# Patient Record
Sex: Female | Born: 1945 | Race: Black or African American | Hispanic: No | Marital: Married | Smoking: Never smoker
Health system: Southern US, Community
[De-identification: ages and names within clinical notes are randomized; demographics above are authoritative.]

## PROBLEM LIST (undated history)

## (undated) DIAGNOSIS — E119 Type 2 diabetes mellitus without complications: Secondary | ICD-10-CM

## (undated) DIAGNOSIS — I10 Essential (primary) hypertension: Secondary | ICD-10-CM

## (undated) HISTORY — PX: TUBAL LIGATION: SHX77

## (undated) HISTORY — PX: OTHER SURGICAL HISTORY: SHX169

---

## 2000-09-09 ENCOUNTER — Ambulatory Visit (HOSPITAL_COMMUNITY): Admission: RE | Admit: 2000-09-09 | Discharge: 2000-09-09 | Payer: Self-pay | Admitting: *Deleted

## 2000-09-09 ENCOUNTER — Encounter: Payer: Self-pay | Admitting: *Deleted

## 2001-10-22 ENCOUNTER — Encounter: Payer: Self-pay | Admitting: *Deleted

## 2001-10-22 ENCOUNTER — Emergency Department (HOSPITAL_COMMUNITY): Admission: EM | Admit: 2001-10-22 | Discharge: 2001-10-22 | Payer: Self-pay

## 2002-04-21 ENCOUNTER — Emergency Department (HOSPITAL_COMMUNITY): Admission: EM | Admit: 2002-04-21 | Discharge: 2002-04-21 | Payer: Self-pay | Admitting: Internal Medicine

## 2002-04-21 ENCOUNTER — Encounter: Payer: Self-pay | Admitting: Internal Medicine

## 2002-06-29 ENCOUNTER — Ambulatory Visit (HOSPITAL_COMMUNITY): Admission: RE | Admit: 2002-06-29 | Discharge: 2002-06-29 | Payer: Self-pay | Admitting: Emergency Medicine

## 2002-06-29 ENCOUNTER — Encounter: Payer: Self-pay | Admitting: Family Medicine

## 2003-06-14 ENCOUNTER — Ambulatory Visit (HOSPITAL_COMMUNITY): Admission: RE | Admit: 2003-06-14 | Discharge: 2003-06-14 | Payer: Self-pay | Admitting: Family Medicine

## 2003-08-06 ENCOUNTER — Other Ambulatory Visit: Admission: RE | Admit: 2003-08-06 | Discharge: 2003-08-06 | Payer: Self-pay | Admitting: Dermatology

## 2004-06-16 ENCOUNTER — Ambulatory Visit (HOSPITAL_COMMUNITY): Admission: RE | Admit: 2004-06-16 | Discharge: 2004-06-16 | Payer: Self-pay | Admitting: Family Medicine

## 2006-06-03 ENCOUNTER — Ambulatory Visit (HOSPITAL_COMMUNITY): Admission: RE | Admit: 2006-06-03 | Discharge: 2006-06-03 | Payer: Self-pay | Admitting: Family Medicine

## 2007-04-29 ENCOUNTER — Ambulatory Visit: Payer: Self-pay | Admitting: Gastroenterology

## 2007-04-29 ENCOUNTER — Ambulatory Visit (HOSPITAL_COMMUNITY): Admission: RE | Admit: 2007-04-29 | Discharge: 2007-04-29 | Payer: Self-pay | Admitting: Gastroenterology

## 2007-06-07 ENCOUNTER — Ambulatory Visit (HOSPITAL_COMMUNITY): Admission: RE | Admit: 2007-06-07 | Discharge: 2007-06-07 | Payer: Self-pay | Admitting: Nurse Practitioner

## 2008-06-12 ENCOUNTER — Ambulatory Visit (HOSPITAL_COMMUNITY): Admission: RE | Admit: 2008-06-12 | Discharge: 2008-06-12 | Payer: Self-pay | Admitting: Nurse Practitioner

## 2009-06-13 ENCOUNTER — Ambulatory Visit (HOSPITAL_COMMUNITY): Admission: RE | Admit: 2009-06-13 | Discharge: 2009-06-13 | Payer: Self-pay | Admitting: Nurse Practitioner

## 2010-02-16 ENCOUNTER — Encounter: Payer: Self-pay | Admitting: Nurse Practitioner

## 2010-06-10 ENCOUNTER — Other Ambulatory Visit (HOSPITAL_COMMUNITY): Payer: Self-pay | Admitting: Nurse Practitioner

## 2010-06-10 DIAGNOSIS — Z1239 Encounter for other screening for malignant neoplasm of breast: Secondary | ICD-10-CM

## 2010-06-10 NOTE — Op Note (Signed)
[name], [name]              ACCOUNT NO.:  [id number]   MEDICAL RECORD NO.:  [id number]          PATIENT TYPE:  AMB   LOCATION:  DAY                           FACILITY:  [facility]   PHYSICIAN:  Kassie Mends, M.D.      DATE OF BIRTH:  01-Jul-1945   DATE OF PROCEDURE:  04/29/2007  DATE OF DISCHARGE:                               OPERATIVE REPORT   PRIMARY CARE Treshon Stannard:  Concha Pyo, NP   PROCEDURE:  Colonoscopy.   INDICATIONS FOR PROCEDURE:  Martha Ray is a 65 year old female who  presents for average risk colon cancer screening.   FINDINGS:  1. Frequent sigmoid colon diverticula with stools impacted.  2. Small internal hemorrhoids. Otherwise no polyps, masses,      inflammatory changes, or AVMs seen.   RECOMMENDATIONS:  1. She should follow a high fiber diet.  She is given a hand out on      high fiber diet, diverticulosis, and hemorrhoids.  2. Colonoscopy in 10 years.   MEDICATIONS:  1. Demerol 50 mg IV.  2. Versed 5 mg IV.   PROCEDURE TECHNIQUE:  Physical exam was performed.  Informed consent was  obtained from the patient that explained the benefits, risks, and  alternatives to the procedure.  The patient was connected to the monitor  and placed in the left lateral position.  Continuous oxygen was provided  by nasal cannula.  IV medicine administered.  After administration of  sedation and rectal exam, the patient's rectum was intubated and scope  was advanced under  direct visualization to the cecum.  The scope was removed slowly by  carefully examining the color, texture, anatomy, and integrity of the  mucosa on the way out.  The patient was recovered at endoscopy and  discharged home in satisfactory condition.      Kassie Mends, M.D.  Electronically Signed     SM/MEDQ  D:  04/29/2007  T:  04/29/2007  Job:  [id number]   cc:   Concha Pyo, NP  [address] [place], [place] [address]

## 2010-06-17 ENCOUNTER — Ambulatory Visit (HOSPITAL_COMMUNITY)
Admission: RE | Admit: 2010-06-17 | Discharge: 2010-06-17 | Disposition: A | Discharge status: Home / Self Care | Payer: 59 | Source: Ambulatory Visit | Attending: Nurse Practitioner | Admitting: Nurse Practitioner

## 2010-06-17 DIAGNOSIS — Z1231 Encounter for screening mammogram for malignant neoplasm of breast: Secondary | ICD-10-CM | POA: Insufficient documentation

## 2010-06-17 DIAGNOSIS — Z1239 Encounter for other screening for malignant neoplasm of breast: Secondary | ICD-10-CM

## 2010-12-05 ENCOUNTER — Telehealth (HOSPITAL_COMMUNITY): Payer: Self-pay | Admitting: *Deleted

## 2011-06-23 ENCOUNTER — Other Ambulatory Visit (HOSPITAL_COMMUNITY): Payer: Self-pay | Admitting: Nurse Practitioner

## 2011-06-23 DIAGNOSIS — Z139 Encounter for screening, unspecified: Secondary | ICD-10-CM

## 2011-06-24 ENCOUNTER — Other Ambulatory Visit (HOSPITAL_COMMUNITY): Payer: Self-pay | Admitting: Nurse Practitioner

## 2011-06-24 DIAGNOSIS — Z139 Encounter for screening, unspecified: Secondary | ICD-10-CM

## 2011-06-25 ENCOUNTER — Ambulatory Visit (HOSPITAL_COMMUNITY)
Admission: RE | Admit: 2011-06-25 | Discharge: 2011-06-25 | Disposition: A | Discharge status: Home / Self Care | Payer: 59 | Source: Ambulatory Visit | Attending: Nurse Practitioner | Admitting: Nurse Practitioner

## 2011-06-25 DIAGNOSIS — Z1231 Encounter for screening mammogram for malignant neoplasm of breast: Secondary | ICD-10-CM | POA: Insufficient documentation

## 2011-06-25 DIAGNOSIS — Z139 Encounter for screening, unspecified: Secondary | ICD-10-CM

## 2011-06-29 ENCOUNTER — Other Ambulatory Visit (HOSPITAL_COMMUNITY): Payer: 59

## 2012-06-21 ENCOUNTER — Other Ambulatory Visit (HOSPITAL_COMMUNITY): Payer: Self-pay | Admitting: Nurse Practitioner

## 2012-06-21 DIAGNOSIS — Z0000 Encounter for general adult medical examination without abnormal findings: Secondary | ICD-10-CM

## 2012-06-27 ENCOUNTER — Ambulatory Visit (HOSPITAL_COMMUNITY)
Admission: RE | Admit: 2012-06-27 | Discharge: 2012-06-27 | Disposition: A | Discharge status: Home / Self Care | Payer: 59 | Source: Ambulatory Visit | Attending: Nurse Practitioner | Admitting: Nurse Practitioner

## 2012-06-27 DIAGNOSIS — Z1231 Encounter for screening mammogram for malignant neoplasm of breast: Secondary | ICD-10-CM | POA: Insufficient documentation

## 2012-06-27 DIAGNOSIS — Z0000 Encounter for general adult medical examination without abnormal findings: Secondary | ICD-10-CM

## 2013-06-13 ENCOUNTER — Other Ambulatory Visit (HOSPITAL_COMMUNITY): Payer: Self-pay | Admitting: Internal Medicine

## 2013-06-13 DIAGNOSIS — Z1239 Encounter for other screening for malignant neoplasm of breast: Secondary | ICD-10-CM

## 2013-06-13 DIAGNOSIS — Z1231 Encounter for screening mammogram for malignant neoplasm of breast: Secondary | ICD-10-CM

## 2013-06-23 ENCOUNTER — Ambulatory Visit (HOSPITAL_COMMUNITY): Payer: 59

## 2013-07-03 ENCOUNTER — Ambulatory Visit (HOSPITAL_COMMUNITY)
Admission: RE | Admit: 2013-07-03 | Discharge: 2013-07-03 | Disposition: A | Discharge status: Home / Self Care | Payer: 59 | Source: Ambulatory Visit | Attending: Internal Medicine | Admitting: Internal Medicine

## 2013-07-03 DIAGNOSIS — Z1231 Encounter for screening mammogram for malignant neoplasm of breast: Secondary | ICD-10-CM | POA: Insufficient documentation

## 2013-07-21 ENCOUNTER — Encounter (HOSPITAL_COMMUNITY): Payer: Self-pay | Admitting: Emergency Medicine

## 2013-07-21 ENCOUNTER — Emergency Department (HOSPITAL_COMMUNITY)
Admission: EM | Admit: 2013-07-21 | Discharge: 2013-07-21 | Disposition: A | Discharge status: Home / Self Care | Payer: 59 | Attending: Emergency Medicine | Admitting: Emergency Medicine

## 2013-07-21 ENCOUNTER — Emergency Department (HOSPITAL_COMMUNITY): Payer: 59

## 2013-07-21 DIAGNOSIS — S82832A Other fracture of upper and lower end of left fibula, initial encounter for closed fracture: Secondary | ICD-10-CM

## 2013-07-21 DIAGNOSIS — Y9389 Activity, other specified: Secondary | ICD-10-CM | POA: Insufficient documentation

## 2013-07-21 DIAGNOSIS — S82899A Other fracture of unspecified lower leg, initial encounter for closed fracture: Secondary | ICD-10-CM | POA: Insufficient documentation

## 2013-07-21 DIAGNOSIS — R296 Repeated falls: Secondary | ICD-10-CM | POA: Insufficient documentation

## 2013-07-21 DIAGNOSIS — X500XXA Overexertion from strenuous movement or load, initial encounter: Secondary | ICD-10-CM | POA: Insufficient documentation

## 2013-07-21 DIAGNOSIS — Y929 Unspecified place or not applicable: Secondary | ICD-10-CM | POA: Insufficient documentation

## 2013-07-21 DIAGNOSIS — I10 Essential (primary) hypertension: Secondary | ICD-10-CM | POA: Insufficient documentation

## 2013-07-21 HISTORY — DX: Essential (primary) hypertension: I10

## 2013-07-21 MED ORDER — OXYCODONE-ACETAMINOPHEN 5-325 MG PO TABS: 1.0000 | ORAL_TABLET | ORAL | Status: DC | PRN

## 2013-07-21 NOTE — ED Notes (Signed)
Patient states she fell about a foot off her deck; and landed on left ankle and twisted.

## 2013-07-21 NOTE — Discharge Instructions (Signed)
Wear the cam walker until cleared by orthopedic doctor to remove it.  Ankle Fracture A fracture is a break in a bone. The ankle joint is made up of three bones. These include the lower (distal)sections of your lower leg bones, called the tibia and fibula, along with a bone in your foot, called the talus. Depending on how bad the break is and if more than one ankle joint bone is broken, a cast or splint is used to protect and keep your injured bone from moving while it heals. Sometimes, surgery is required to help the fracture heal properly.  There are two general types of fractures:  Stable fracture. This includes a single fracture line through one bone, with no injury to ankle ligaments. A fracture of the talus that does not have any displacement (movement of the bone on either side of the fracture line) is also stable.  Unstable fracture. This includes more than one fracture line through one or more bones in the ankle joint. It also includes fractures that have displacement of the bone on either side of the fracture line. CAUSES  A direct blow to the ankle.   Quickly and severely twisting your ankle.  Trauma, such as a car accident or falling from a significant height. RISK FACTORS You may be at a higher risk of ankle fracture if:  You have certain medical conditions.  You are involved in high-impact sports.  You are involved in a high-impact car accident. SIGNS AND SYMPTOMS   Tender and swollen ankle.  Bruising around the injured ankle.  Pain on movement of the ankle.  Difficulty walking or putting weight on the ankle.  A cold foot below the site of the ankle injury. This can occur if the blood vessels passing through your injured ankle were also damaged.  Numbness in the foot below the site of the ankle injury. DIAGNOSIS  An ankle fracture is usually diagnosed with a physical exam and X-rays. A CT scan may also be required for complex fractures. TREATMENT  Stable  fractures are treated with a cast or splint and using crutches to avoid putting weight on your injured ankle. This is followed by an ankle strengthening program. Some patients require a special type of cast, depending on other medical problems they may have. Unstable fractures require surgery to ensure the bones heal properly. Your health care provider will tell you what type of fracture you have and the best treatment for your condition. HOME CARE INSTRUCTIONS   Review correct crutch use with your health care provider and use your crutches as directed. Safe use of crutches is extremely important. Misuse of crutches can cause you to fall or cause injury to nerves in your hands or armpits.  Do not put weight or pressure on the injured ankle until directed by your health care provider.  To lessen the swelling, keep the injured leg elevated while sitting or lying down.  Apply ice to the injured area:  Put ice in a plastic bag.  Place a towel between your cast and the bag.  Leave the ice on for 20 minutes, 2-3 times a day.  If you have a plaster or fiberglass cast:  Do not try to scratch the skin under the cast with any objects. This can increase your risk of skin infection.  Check the skin around the cast every day. You may put lotion on any red or sore areas.  Keep your cast dry and clean.  If you have a plaster  splint:  Wear the splint as directed.  You may loosen the elastic around the splint if your toes become numb, tingle, or turn cold or blue.  Do not put pressure on any part of your cast or splint; it may break. Rest your cast only on a pillow the first 24 hours until it is fully hardened.  Your cast or splint can be protected during bathing with a plastic bag sealed to your skin with medical tape. Do not lower the cast or splint into water.  Take medicines as directed by your health care provider. Only take over-the-counter or prescription medicines for pain, discomfort, or  fever as directed by your health care provider.  Do not drive a vehicle until your health care provider specifically tells you it is safe to do so.  If your health care provider has given you a follow-up appointment, it is very important to keep that appointment. Not keeping the appointment could result in a chronic or permanent injury, pain, and disability. If you have any problem keeping the appointment, call the facility for assistance. SEEK MEDICAL CARE IF: You develop increased swelling or discomfort. SEEK IMMEDIATE MEDICAL CARE IF:   Your cast gets damaged or breaks.  You have continued severe pain.  You develop new pain or swelling after the cast was put on.  Your skin or toenails below the injury turn blue or gray.  Your skin or toenails below the injury feel cold, numb, or have loss of sensitivity to touch.  There is a bad smell or pus draining from under the cast. MAKE SURE YOU:   Understand these instructions.  Will watch your condition.  Will get help right away if you are not doing well or get worse. Document Released: 01/10/2000 Document Revised: 01/17/2013 Document Reviewed: 08/11/2012 [facility] Patient Information 2015 [place], [place]. This information is not intended to replace advice given to you by your health care provider. Make sure you discuss any questions you have with your health care provider.  Acetaminophen; Oxycodone tablets What is this medicine? ACETAMINOPHEN; OXYCODONE (a set a MEE noe fen; ox i KOE done) is a pain reliever. It is used to treat mild to moderate pain. This medicine may be used for other purposes; ask your health care provider or pharmacist if you have questions. COMMON BRAND NAME(S): Endocet, Magnacet, Narvox, Percocet, Perloxx, Primalev, Primlev, Roxicet, Xolox What should I tell my health care provider before I take this medicine? They need to know if you have any of these conditions: -brain tumor -Crohn's disease, inflammatory  bowel disease, or ulcerative colitis -drug abuse or addiction -head injury -heart or circulation problems -if you often drink alcohol -kidney disease or problems going to the bathroom -liver disease -lung disease, asthma, or breathing problems -an unusual or allergic reaction to acetaminophen, oxycodone, other opioid analgesics, other medicines, foods, dyes, or preservatives -pregnant or trying to get pregnant -breast-feeding How should I use this medicine? Take this medicine by mouth with a full glass of water. Follow the directions on the prescription label. Take your medicine at regular intervals. Do not take your medicine more often than directed. Talk to your pediatrician regarding the use of this medicine in children. Special care may be needed. Patients over 30 years old may have a stronger reaction and need a smaller dose. Overdosage: If you think you have taken too much of this medicine contact a poison control center or emergency room at once. NOTE: This medicine is only for you. Do not share  this medicine with others. What if I miss a dose? If you miss a dose, take it as soon as you can. If it is almost time for your next dose, take only that dose. Do not take double or extra doses. What may interact with this medicine? -alcohol -antihistamines -barbiturates like amobarbital, butalbital, butabarbital, methohexital, pentobarbital, phenobarbital, thiopental, and secobarbital -benztropine -drugs for bladder problems like solifenacin, trospium, oxybutynin, tolterodine, hyoscyamine, and methscopolamine -drugs for breathing problems like ipratropium and tiotropium -drugs for certain stomach or intestine problems like propantheline, homatropine methylbromide, glycopyrrolate, atropine, belladonna, and dicyclomine -general anesthetics like etomidate, ketamine, nitrous oxide, propofol, desflurane, enflurane, halothane, isoflurane, and sevoflurane -medicines for depression, anxiety, or  psychotic disturbances -medicines for sleep -muscle relaxants -naltrexone -narcotic medicines (opiates) for pain -phenothiazines like perphenazine, thioridazine, chlorpromazine, mesoridazine, fluphenazine, prochlorperazine, promazine, and trifluoperazine -scopolamine -tramadol -trihexyphenidyl This list may not describe all possible interactions. Give your health care provider a list of all the medicines, herbs, non-prescription drugs, or dietary supplements you use. Also tell them if you smoke, drink alcohol, or use illegal drugs. Some items may interact with your medicine. What should I watch for while using this medicine? Tell your doctor or health care professional if your pain does not go away, if it gets worse, or if you have new or a different type of pain. You may develop tolerance to the medicine. Tolerance means that you will need a higher dose of the medication for pain relief. Tolerance is normal and is expected if you take this medicine for a long time. Do not suddenly stop taking your medicine because you may develop a severe reaction. Your body becomes used to the medicine. This does NOT mean you are addicted. Addiction is a behavior related to getting and using a drug for a non-medical reason. If you have pain, you have a medical reason to take pain medicine. Your doctor will tell you how much medicine to take. If your doctor wants you to stop the medicine, the dose will be slowly lowered over time to avoid any side effects. You may get drowsy or dizzy. Do not drive, use machinery, or do anything that needs mental alertness until you know how this medicine affects you. Do not stand or sit up quickly, especially if you are an older patient. This reduces the risk of dizzy or fainting spells. Alcohol may interfere with the effect of this medicine. Avoid alcoholic drinks. There are different types of narcotic medicines (opiates) for pain. If you take more than one type at the same time, you  may have more side effects. Give your health care provider a list of all medicines you use. Your doctor will tell you how much medicine to take. Do not take more medicine than directed. Call emergency for help if you have problems breathing. The medicine will cause constipation. Try to have a bowel movement at least every 2 to 3 days. If you do not have a bowel movement for 3 days, call your doctor or health care professional. Do not take Tylenol (acetaminophen) or medicines that have acetaminophen with this medicine. Too much acetaminophen can be very dangerous. Many nonprescription medicines contain acetaminophen. Always read the labels carefully to avoid taking more acetaminophen. What side effects may I notice from receiving this medicine? Side effects that you should report to your doctor or health care professional as soon as possible: -allergic reactions like skin rash, itching or hives, swelling of the face, lips, or tongue -breathing difficulties, wheezing -confusion -light headedness  or fainting spells -severe stomach pain -unusually weak or tired -yellowing of the skin or the whites of the eyes Side effects that usually do not require medical attention (report to your doctor or health care professional if they continue or are bothersome): -dizziness -drowsiness -nausea -vomiting This list may not describe all possible side effects. Call your doctor for medical advice about side effects. You may report side effects to FDA at 1-800-FDA-1088. Where should I keep my medicine? Keep out of the reach of children. This medicine can be abused. Keep your medicine in a safe place to protect it from theft. Do not share this medicine with anyone. Selling or giving away this medicine is dangerous and against the law. Store at room temperature between 20 and 25 degrees C (68 and 77 degrees F). Keep container tightly closed. Protect from light. This medicine may cause accidental overdose and death if  it is taken by other adults, children, or pets. Flush any unused medicine down the toilet to reduce the chance of harm. Do not use the medicine after the expiration date. NOTE: This sheet is a summary. It may not cover all possible information. If you have questions about this medicine, talk to your doctor, pharmacist, or health care provider.  2015, [employer]vier/[employer] Standard. (2012-09-05 13:17:35)

## 2013-07-21 NOTE — ED Provider Notes (Signed)
CSN: [id number]     Arrival date & time 07/21/13  0156 History   First MD Initiated Contact with Patient 07/21/13 0445     Chief Complaint  Patient presents with  . Ankle Pain     (Consider location/radiation/quality/duration/timing/severity/associated sxs/prior Treatment) Patient is a 68 y.o. female presenting with ankle pain. The history is provided by the patient.  Ankle Pain She fell off of her deck and injured her left ankle. She denies other injury. Fall was a distance of about 1 foot. She is complaining of pain in the lateral aspect of her left ankle. This is worse when she ambulates. Pain is moderate and she rates it at 6/10. She has taken naproxen for pain with moderate relief.  Past Medical History  Diagnosis Date  . Hypertension    History reviewed. No pertinent past surgical history. No family history on file. History  Substance Use Topics  . Smoking status: Never Smoker   . Smokeless tobacco: Not on file  . Alcohol Use: No   OB History   Grav Para Term Preterm Abortions TAB SAB Ect Mult Living                 Review of Systems  All other systems reviewed and are negative.     Allergies  Review of patient's allergies indicates no known allergies.  Home Medications   Prior to Admission medications   Medication Sig Start Date End Date Taking? Authorizing Provider  oxyCODONE-acetaminophen (PERCOCET/ROXICET) 5-325 MG per tablet Take 1 tablet by mouth every 4 (four) hours as needed. 07/21/13   Dione Boozeavid Glick, MD   BP 150/81  Pulse 89  Temp(Src) 98 F (36.7 C) (Oral)  Resp 18  Ht 5\' 5"  (1.651 m)  Wt 205 lb (92.987 kg)  BMI 34.11 kg/m2  SpO2 100% Physical Exam  Nursing note and vitals reviewed.  68 year old female, resting comfortably and in no acute distress. Vital signs are significant for hypertension with blood pressure 150/81. Oxygen saturation is 100%, which is normal. Head is normocephalic and atraumatic. PERRLA, EOMI. Oropharynx is clear. Neck is  nontender and supple without adenopathy or JVD. Back is nontender and there is no CVA tenderness. Lungs are clear without rales, wheezes, or rhonchi. Chest is nontender. Heart has regular rate and rhythm without murmur. Abdomen is soft, flat, nontender without masses or hepatosplenomegaly and peristalsis is normoactive. Extremities: There is moderate swelling over the lateral aspect of the left ankle with point tenderness over the lateral malleolus. There is no instability of the ankle mortise and anterior drawer sign is negative. There is a strong dorsalis pedis pulse and capillary refill is prompt. No other extremity injuries seen. Skin is warm and dry without rash. Neurologic: Mental status is normal, cranial nerves are intact, there are no motor or sensory deficits.  ED Course  Procedures (including critical care time)  Imaging Review Dg Ankle Complete Left  07/21/2013   CLINICAL DATA:  Patient fell 1 day ago. Lateral ankle pain with swelling.  EXAM: LEFT ANKLE COMPLETE - 3+ VIEW  COMPARISON:  None.  FINDINGS: Transverse nondisplaced fracture of the distal left fibula with overlying soft tissue swelling.  IMPRESSION: Transverse nondisplaced fracture of the distal left fibula with overlying soft tissue swelling.   Electronically Signed   By: Burman NievesWilliam  Stevens M.D.   On: 07/21/2013 02:31   Images viewed by me.  MDM   Final diagnoses:  Traumatic closed nondisplaced fracture of distal end of left fibula, initial encounter  Fall with a nondisplaced fracture of the lateral malleolus of the left ankle. She is pacemaker M. Zollie BeckersWalter and given crutches and is referred to orthopedics for followup. Prescription is given for oxycodone acetaminophen.    Dione Boozeavid Glick, MD 07/21/13 [phone]

## 2014-08-15 ENCOUNTER — Other Ambulatory Visit (HOSPITAL_COMMUNITY): Payer: Self-pay | Admitting: Sports Medicine

## 2014-08-15 DIAGNOSIS — Z1231 Encounter for screening mammogram for malignant neoplasm of breast: Secondary | ICD-10-CM

## 2014-08-20 ENCOUNTER — Emergency Department (HOSPITAL_COMMUNITY): Payer: Medicare Other

## 2014-08-20 ENCOUNTER — Observation Stay (HOSPITAL_COMMUNITY): Payer: Medicare Other

## 2014-08-20 ENCOUNTER — Inpatient Hospital Stay (HOSPITAL_COMMUNITY)
Admission: EM | Admit: 2014-08-20 | Discharge: 2014-08-22 | DRG: 066 | Disposition: A | Discharge status: Home / Self Care | Payer: Medicare Other | Attending: Internal Medicine | Admitting: Internal Medicine

## 2014-08-20 ENCOUNTER — Encounter (HOSPITAL_COMMUNITY): Payer: Self-pay | Admitting: Emergency Medicine

## 2014-08-20 DIAGNOSIS — E1165 Type 2 diabetes mellitus with hyperglycemia: Secondary | ICD-10-CM | POA: Diagnosis present

## 2014-08-20 DIAGNOSIS — H532 Diplopia: Secondary | ICD-10-CM | POA: Diagnosis not present

## 2014-08-20 DIAGNOSIS — I10 Essential (primary) hypertension: Secondary | ICD-10-CM | POA: Diagnosis present

## 2014-08-20 DIAGNOSIS — Z8673 Personal history of transient ischemic attack (TIA), and cerebral infarction without residual deficits: Secondary | ICD-10-CM

## 2014-08-20 DIAGNOSIS — R739 Hyperglycemia, unspecified: Secondary | ICD-10-CM

## 2014-08-20 DIAGNOSIS — E785 Hyperlipidemia, unspecified: Secondary | ICD-10-CM | POA: Diagnosis present

## 2014-08-20 DIAGNOSIS — E876 Hypokalemia: Secondary | ICD-10-CM | POA: Diagnosis present

## 2014-08-20 DIAGNOSIS — I639 Cerebral infarction, unspecified: Secondary | ICD-10-CM | POA: Diagnosis not present

## 2014-08-20 LAB — COMPREHENSIVE METABOLIC PANEL
ALBUMIN: 3.9 g/dL (ref 3.5–5.0)
ALT: 23 U/L (ref 14–54)
AST: 20 U/L (ref 15–41)
Alkaline Phosphatase: 42 U/L (ref 38–126)
Anion gap: 8 (ref 5–15)
BUN: 14 mg/dL (ref 6–20)
CHLORIDE: 105 mmol/L (ref 101–111)
CO2: 26 mmol/L (ref 22–32)
CREATININE: 1.09 mg/dL — AB (ref 0.44–1.00)
Calcium: 9.3 mg/dL (ref 8.9–10.3)
GFR calc Af Amer: 59 mL/min — ABNORMAL LOW (ref >60)
GFR, EST NON AFRICAN AMERICAN: 51 mL/min — AB (ref >60)
GLUCOSE: 196 mg/dL — AB (ref 65–99)
Potassium: 3.3 mmol/L — ABNORMAL LOW (ref 3.5–5.1)
SODIUM: 139 mmol/L (ref 135–145)
Total Bilirubin: 0.6 mg/dL (ref 0.3–1.2)
Total Protein: 6.6 g/dL (ref 6.5–8.1)

## 2014-08-20 LAB — CBC WITH DIFFERENTIAL/PLATELET
Basophils Absolute: 0.1 10*3/uL (ref 0.0–0.1)
Basophils Relative: 1 % (ref 0–1)
Eosinophils Absolute: 0.1 10*3/uL (ref 0.0–0.7)
Eosinophils Relative: 2 % (ref 0–5)
HEMATOCRIT: 40.5 % (ref 36.0–46.0)
Hemoglobin: 13.7 g/dL (ref 12.0–15.0)
LYMPHS ABS: 1.6 10*3/uL (ref 0.7–4.0)
Lymphocytes Relative: 38 % (ref 12–46)
MCH: 28.3 pg (ref 26.0–34.0)
MCHC: 33.8 g/dL (ref 30.0–36.0)
MCV: 83.7 fL (ref 78.0–100.0)
Monocytes Absolute: 0.4 10*3/uL (ref 0.1–1.0)
Monocytes Relative: 9 % (ref 3–12)
Neutro Abs: 2.1 10*3/uL (ref 1.7–7.7)
Neutrophils Relative %: 50 % (ref 43–77)
Platelets: 366 10*3/uL (ref 150–400)
RBC: 4.84 MIL/uL (ref 3.87–5.11)
RDW: 13 % (ref 11.5–15.5)
WBC: 4.1 10*3/uL (ref 4.0–10.5)

## 2014-08-20 LAB — GLUCOSE, CAPILLARY: Glucose-Capillary: 149 mg/dL — ABNORMAL HIGH (ref 65–99)

## 2014-08-20 MED ORDER — ASPIRIN 325 MG PO TABS
325.0000 mg | ORAL_TABLET | Freq: Every day | ORAL | Status: DC
  Administered 2014-08-20 – 2014-08-22 (×3): 325 mg via ORAL
  Filled 2014-08-20 (×3): qty 1

## 2014-08-20 MED ORDER — ACETAMINOPHEN 650 MG RE SUPP: 650.0000 mg | RECTAL | Status: DC | PRN

## 2014-08-20 MED ORDER — INSULIN ASPART 100 UNIT/ML ~~LOC~~ SOLN
0.0000 [IU] | Freq: Three times a day (TID) | SUBCUTANEOUS | Status: DC
  Administered 2014-08-21: 1 [IU] via SUBCUTANEOUS
  Administered 2014-08-21: 2 [IU] via SUBCUTANEOUS
  Administered 2014-08-22: 1 [IU] via SUBCUTANEOUS
  Administered 2014-08-22: 2 [IU] via SUBCUTANEOUS

## 2014-08-20 MED ORDER — ASPIRIN 300 MG RE SUPP
300.0000 mg | Freq: Every day | RECTAL | Status: DC
  Filled 2014-08-20 (×5): qty 1

## 2014-08-20 MED ORDER — ACETAMINOPHEN 325 MG PO TABS: 650.0000 mg | ORAL_TABLET | ORAL | Status: DC | PRN

## 2014-08-20 MED ORDER — ENOXAPARIN SODIUM 40 MG/0.4ML ~~LOC~~ SOLN
40.0000 mg | SUBCUTANEOUS | Status: DC
  Administered 2014-08-20 – 2014-08-21 (×2): 40 mg via SUBCUTANEOUS
  Filled 2014-08-20 (×2): qty 0.4

## 2014-08-20 MED ORDER — STROKE: EARLY STAGES OF RECOVERY BOOK
Freq: Once | Status: AC
  Administered 2014-08-22: 10:00:00
  Filled 2014-08-20: qty 1
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
## 2014-08-20 NOTE — ED Notes (Signed)
Pt c/o blurred vision and some double vision in left eye since 1430 yesterday. Denies trouble walking/talking. Denies numbness/tingling.

## 2014-08-20 NOTE — ED Notes (Signed)
MD Zammit at bedside. 

## 2014-08-20 NOTE — ED Notes (Signed)
Phlebotomy at bedside.

## 2014-08-20 NOTE — ED Provider Notes (Signed)
CSN: [id number]     Arrival date & time 08/20/14  1035 History   First MD Initiated Contact with Patient 08/20/14 1051     Chief Complaint  Patient presents with  . Blurred Vision     (Consider location/radiation/quality/duration/timing/severity/associated sxs/prior Treatment) Patient is a 69 y.o. female presenting with eye problem. The history is provided by the patient (pt had double vision yesterday and some minor double vision today).  Eye Problem Location:  L eye Quality: no pain. Severity:  Mild Onset quality:  Sudden Timing:  Constant Progression:  Improving Chronicity:  New Associated symptoms: no discharge and no headaches     Past Medical History  Diagnosis Date  . Hypertension    History reviewed. No pertinent past surgical history. No family history on file. History  Substance Use Topics  . Smoking status: Never Smoker   . Smokeless tobacco: Not on file  . Alcohol Use: No   OB History    No data available     Review of Systems  Constitutional: Negative for appetite change and fatigue.  HENT: Negative for congestion, ear discharge and sinus pressure.        Double vision  Eyes: Negative for discharge.  Respiratory: Negative for cough.   Cardiovascular: Negative for chest pain.  Gastrointestinal: Negative for abdominal pain and diarrhea.  Genitourinary: Negative for frequency and hematuria.  Musculoskeletal: Negative for back pain.  Skin: Negative for rash.  Neurological: Negative for seizures and headaches.  Psychiatric/Behavioral: Negative for hallucinations.      Allergies  Review of patient's allergies indicates no known allergies.  Home Medications   Prior to Admission medications   Medication Sig Start Date End Date Taking? Authorizing Provider  losartan-hydrochlorothiazide (HYZAAR) 50-12.5 MG per tablet Take 1 tablet by mouth daily.   Yes Historical Provider, MD  metFORMIN (GLUCOPHAGE) 500 MG tablet Take 500 mg by mouth daily with  breakfast.   Yes Historical Provider, MD  oxyCODONE-acetaminophen (PERCOCET/ROXICET) 5-325 MG per tablet Take 1 tablet by mouth every 4 (four) hours as needed. Patient not taking: Reported on 08/20/2014 07/21/13   Dione Booze, MD   BP 154/88 mmHg  Pulse 80  Temp(Src) 98.1 F (36.7 C) (Oral)  Resp 10  Ht 5\' 6"  (1.676 m)  Wt 193 lb (87.544 kg)  BMI 31.17 kg/m2  SpO2 100% Physical Exam  Constitutional: She is oriented to person, place, and time. She appears well-developed.  HENT:  Head: Normocephalic.  Eyes: Conjunctivae and EOM are normal. Pupils are equal, round, and reactive to light. Left eye exhibits no discharge. No scleral icterus.  Disc sharp  Neck: Neck supple. No thyromegaly present.  Cardiovascular: Normal rate and regular rhythm.  Exam reveals no gallop and no friction rub.   No murmur heard. Pulmonary/Chest: No stridor. She has no wheezes. She has no rales. She exhibits no tenderness.  Abdominal: She exhibits no distension. There is no tenderness. There is no rebound.  Musculoskeletal: Normal range of motion. She exhibits no edema.  Lymphadenopathy:    She has no cervical adenopathy.  Neurological: She is oriented to person, place, and time. She exhibits normal muscle tone. Coordination normal.  Skin: No rash noted. No erythema.  Psychiatric: She has a normal mood and affect. Her behavior is normal.    ED Course  Procedures (including critical care time) Labs Review Labs Reviewed  COMPREHENSIVE METABOLIC PANEL - Abnormal; Notable for the following:    Potassium 3.3 (*)    Glucose, Bld 196 (*)  Creatinine, Ser 1.09 (*)    GFR calc non Af Amer 51 (*)    GFR calc Af Amer 59 (*)    All other components within normal limits  CBC WITH DIFFERENTIAL/PLATELET    Imaging Review Ct Head Wo Contrast  08/20/2014   CLINICAL DATA:  Blurry vision for 1 day, initial encounter  EXAM: CT HEAD WITHOUT CONTRAST  TECHNIQUE: Contiguous axial images were obtained from the base of  the skull through the vertex without intravenous contrast.  COMPARISON:  None.  FINDINGS: The bony calvarium is intact. The ventricles are of normal size and configuration. No findings to suggest acute hemorrhage, acute infarction or space-occupying mass lesion are noted.  IMPRESSION: No acute abnormality noted.   Electronically Signed   By: Alcide Clever M.D.   On: 08/20/2014 11:26   Mr Brain Wo Contrast  08/20/2014   CLINICAL DATA:  Altered mental status and blurred vision, 1 day duration.  EXAM: MRI HEAD WITHOUT CONTRAST  TECHNIQUE: Multiplanar, multiecho pulse sequences of the brain and surrounding structures were obtained without intravenous contrast.  COMPARISON:  CT 08/20/2014  FINDINGS: Diffusion imaging shows a 4 mm acute infarction in the peripheral midportion of the right cerebellum. No other acute infarction is identified.  The brainstem is normal. No other cerebellar abnormality. Within the cerebral hemispheres, there are a few small foci of T2 and FLAIR signal in the white matter consistent with old small vessel infarctions. No cortical or large vessel territory infarction. No mass lesion, hemorrhage, hydrocephalus or extra-axial collection. No pituitary mass. No inflammatory sinus disease. No skull or skullbase lesion.  IMPRESSION: 4 mm acute infarction at the posterior medial midportion of the right cerebellum. No other acute infarction.  Mild chronic small-vessel ischemic change affecting the cerebral hemispheric white matter.   Electronically Signed   By: Paulina Fusi M.D.   On: 08/20/2014 13:19     EKG Interpretation   Date/Time:  Monday August 20 2014 15:08:21 EDT Ventricular Rate:  75 PR Interval:  176 QRS Duration: 95 QT Interval:  366 QTC Calculation: 409 R Axis:   60 Text Interpretation:  Sinus rhythm Low voltage, precordial leads Confirmed  by Tamula Morrical  MD, Graceanna Theissen ([id number]) on 08/20/2014 3:26:45 PM      MDM   Final diagnoses:  Stroke    Mri shows cva.  Will admit      Bethann Berkshire, MD 08/20/14 1531

## 2014-08-20 NOTE — ED Notes (Signed)
Patient would like something to drink at this time. 

## 2014-08-20 NOTE — Progress Notes (Signed)
Pt removed her earrings for CT and placed them into a ziploc bag, when she left the CT room the ziploc bag was in her hand

## 2014-08-21 ENCOUNTER — Observation Stay (HOSPITAL_COMMUNITY): Payer: Medicare Other

## 2014-08-21 DIAGNOSIS — I10 Essential (primary) hypertension: Secondary | ICD-10-CM

## 2014-08-21 DIAGNOSIS — I639 Cerebral infarction, unspecified: Secondary | ICD-10-CM | POA: Diagnosis present

## 2014-08-21 DIAGNOSIS — R739 Hyperglycemia, unspecified: Secondary | ICD-10-CM | POA: Diagnosis not present

## 2014-08-21 DIAGNOSIS — E1165 Type 2 diabetes mellitus with hyperglycemia: Secondary | ICD-10-CM | POA: Diagnosis present

## 2014-08-21 DIAGNOSIS — E876 Hypokalemia: Secondary | ICD-10-CM | POA: Diagnosis present

## 2014-08-21 DIAGNOSIS — E785 Hyperlipidemia, unspecified: Secondary | ICD-10-CM | POA: Diagnosis present

## 2014-08-21 DIAGNOSIS — H532 Diplopia: Secondary | ICD-10-CM | POA: Diagnosis not present

## 2014-08-21 DIAGNOSIS — I63011 Cerebral infarction due to thrombosis of right vertebral artery: Secondary | ICD-10-CM | POA: Diagnosis not present

## 2014-08-21 LAB — LIPID PANEL
CHOL/HDL RATIO: 6.5 ratio
CHOLESTEROL: 208 mg/dL — AB (ref 0–200)
HDL: 32 mg/dL — AB (ref >40)
LDL CALC: 145 mg/dL — AB (ref 0–99)
Triglycerides: 157 mg/dL — ABNORMAL HIGH (ref <150)
VLDL: 31 mg/dL (ref 0–40)

## 2014-08-21 LAB — GLUCOSE, CAPILLARY
Glucose-Capillary: 140 mg/dL — ABNORMAL HIGH (ref 65–99)
Glucose-Capillary: 163 mg/dL — ABNORMAL HIGH (ref 65–99)
Glucose-Capillary: 105 mg/dL — ABNORMAL HIGH (ref 65–99)
Glucose-Capillary: 133 mg/dL — ABNORMAL HIGH (ref 65–99)

## 2014-08-21 MED ORDER — ATORVASTATIN CALCIUM 20 MG PO TABS
20.0000 mg | ORAL_TABLET | Freq: Every day | ORAL | Status: DC
  Administered 2014-08-21: 20 mg via ORAL
  Filled 2014-08-21: qty 1

## 2014-08-21 NOTE — Care Management Note (Signed)
Case Management Note  Patient Details  Name: Martha Ray MRN: [id number] Date of Birth: 09-Sep-1945  Expected Discharge Date:   08/21/2014               Expected Discharge Plan:  Home/Self Care  In-House Referral:  NA  Discharge planning Services  CM Consult  Post Acute Care Choice:  NA Choice offered to:  NA  DME Arranged:    DME Agency:     HH Arranged:    HH Agency:     Status of Service:  Completed, signed off  Medicare Important Message Given:    Date Medicare IM Given:    Medicare IM give by:    Date Additional Medicare IM Given:    Additional Medicare Important Message give by:     If discussed at Long Length of Stay Meetings, dates discussed:    Additional Comments: Pt is from home, lives with family and independent at baseline. Pt has no DME or HH services at discharge. MD anticipates DC in 24 hours. Pt plans to discharge home with self care. At the time of assessment pt OBS, notification signed by patient, copy given to patient and original placed in pt's shadow chart. No CM needs.  Malcolm Metro, RN 08/21/2014, 1:53 PM

## 2014-08-21 NOTE — Progress Notes (Signed)
OT Cancellation Note  Patient Details Name: Martha Ray MRN: [id number] DOB: [birth year]   Cancelled Treatment:     Reason evaluation not completed: Pt unavailable this am, out of room for testing.   Ezra Sites, OTR/L  [phone] 08/21/2014, 8:36 AM
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
## 2014-08-21 NOTE — Progress Notes (Signed)
[facility]                                                                              Patient Demographics  Martha Ray, is a 69 y.o. female, DOB - [birth year], ZOX:[id number]  Admit date - 08/20/2014   Admitting Physician Standley Brooking, MD  Outpatient Primary MD for the patient is [facility]  LOS - 1   Chief Complaint  Patient presents with  . Blurred Vision      Summary 69 year old female presented with complaints of blurry vision.  MRI of the brain confirmed small stroke. Patient admitted for stroke workup.  Assessment & Plan   Acute CVA -Patient presented with Diplopia (which is improving) -CT head: No acute abnormality noted -MRI brain: 4 mm acute infarct at the posterior medial midportion of the right cerebellum -Echocardiogram: Pending -Carotid doppler: 1-49% stenosis proximal left internal carotid artery secondary to small volume heterogeneous but smooth atherosclerotic plaque, right distal carotid intimal medial thickening, vertebral arteries are patent with normal antegrade flow -LDL: 145 -hemoglobin A1c -PT/OT consulted, patient does not need further therapy -Neurology consulted and appreciated -Continue aspirin -Will start atorvastatin  Hyperlipidemia -Lipid panel: total cholesterol 208, triglycerides 157, HDL 32, LDL 145  -Atorvastatin started   Hypokalemia -Replaced, will repeat a BMP   Diabetes Mellitus, type 2 -Hemoglobin A1c pending  -Patient uses metformin at home   Hypertension -Allowing for permissive hypertension, Hyzaar held   Code Status: Full  Family Communication: Husband at bedside  Disposition Plan: Admitted.  Suspect discharge on 08/22/2014  Time Spent in minutes   30 minutes  Procedures  Carotid doppler  Consults   Neurology  DVT Prophylaxis  Lovenox  Lab Results  Component Value Date   PLT 366 08/20/2014    Medications  Scheduled Meds: .  stroke: mapping our early stages of  recovery book   Does not apply Once  . aspirin  300 mg Rectal Daily   Or  . aspirin  325 mg Oral Daily  . atorvastatin  20 mg Oral q1800  . enoxaparin (LOVENOX) injection  40 mg Subcutaneous Q24H  . insulin aspart  0-9 Units Subcutaneous TID WC   Continuous Infusions:  PRN Meds:.acetaminophen **OR** acetaminophen  Antibiotics    Anti-infectives    None      Subjective:   Javier Glazier seen and examined today.  Patient feels that her vision has improved slightly. Denies any chest pain, shortness breath, abdominal pain, dizziness or headache at this time.   Objective:   Filed Vitals:   08/21/14 0100 08/21/14 0300 08/21/14 0625 08/21/14 0700  BP: 134/75 140/80 134/57 134/57  Pulse: 75 80 79 79  Temp: 98.5 F (36.9 C) 97.8 F (36.6 C) 98 F (36.7 C) 98 F (36.7 C)  TempSrc: Oral Oral Oral Oral  Resp: Height:      Weight:      SpO2: 100% 100% 98% 98%    Wt Readings from Last 3 Encounters:  08/20/14 87.227 kg (192 lb 4.8 oz)  07/21/13 92.987 kg (205 lb)     Intake/Output Summary (Last 24 hours) at 08/21/14 1103 Last data  filed at 08/21/14 0800  Gross per 24 hour  Intake    480 ml  Output    650 ml  Net   -170 ml    Exam  General: Well developed, well nourished, NAD, appears stated age  HEENT: NCAT, mucous membranes moist.   Cardiovascular: S1 S2 auscultated, no rubs, murmurs or gallops. Regular rate and rhythm.  Respiratory: Clear to auscultation bilaterally with equal chest rise  Abdomen: Soft, nontender, nondistended, + bowel sounds  Extremities: warm dry without cyanosis clubbing or edema  Neuro: AAOx3, nonfocal  Psych: Normal affect and demeanor with intact judgement and insight  Data Review   Micro Results No results found for this or any previous visit (from the past 240 hour(s)).  Radiology Reports Dg Chest 2 View  08/20/2014   CLINICAL DATA:  Stroke  EXAM: CHEST  2 VIEW  COMPARISON:  None.  FINDINGS: Lungs are under  aerated and grossly clear. Normal heart size. No pneumothorax. No pleural effusion.  IMPRESSION: No active cardiopulmonary disease.   Electronically Signed   By: Jolaine Click M.D.   On: 08/20/2014 20:47   Ct Head Wo Contrast  08/20/2014   CLINICAL DATA:  Blurry vision for 1 day, initial encounter  EXAM: CT HEAD WITHOUT CONTRAST  TECHNIQUE: Contiguous axial images were obtained from the base of the skull through the vertex without intravenous contrast.  COMPARISON:  None.  FINDINGS: The bony calvarium is intact. The ventricles are of normal size and configuration. No findings to suggest acute hemorrhage, acute infarction or space-occupying mass lesion are noted.  IMPRESSION: No acute abnormality noted.   Electronically Signed   By: Alcide Clever M.D.   On: 08/20/2014 11:26   Mr Maxine Glenn Head Wo Contrast  08/21/2014   CLINICAL DATA:  Right cerebellar infarct.  Weakness.  Diplopia  EXAM: MRA HEAD WITHOUT CONTRAST  TECHNIQUE: Angiographic images of the Circle of Willis were obtained using MRA technique without intravenous contrast.  COMPARISON:  MRI brain 08/20/2014  FINDINGS: The internal carotid arteries are within normal limits from the high cervical segments through the ICA termini bilaterally. The A1 and M1 segments are normal. ACA and MCA branch vessels are intact. Artifactual signal loss is noted across the ACA and MCA branch vessels at the slab overlap.  The left vertebral artery is the dominant vessel. PICA origins are visualized and normal bilaterally. The basilar artery is within normal limits. Both posterior cerebral arteries originate from the basilar tip. The PCA branch vessels are within normal limits.  IMPRESSION: Normal variant circle of Willis without evidence for significant proximal stenosis, aneurysm, or branch vessel occlusion.   Electronically Signed   By: Marin Roberts M.D.   On: 08/21/2014 08:59   Mr Brain Wo Contrast  08/20/2014   CLINICAL DATA:  Altered mental status and blurred  vision, 1 day duration.  EXAM: MRI HEAD WITHOUT CONTRAST  TECHNIQUE: Multiplanar, multiecho pulse sequences of the brain and surrounding structures were obtained without intravenous contrast.  COMPARISON:  CT 08/20/2014  FINDINGS: Diffusion imaging shows a 4 mm acute infarction in the peripheral midportion of the right cerebellum. No other acute infarction is identified.  The brainstem is normal. No other cerebellar abnormality. Within the cerebral hemispheres, there are a few small foci of T2 and FLAIR signal in the white matter consistent with old small vessel infarctions. No cortical or large vessel territory infarction. No mass lesion, hemorrhage, hydrocephalus or extra-axial collection. No pituitary mass. No inflammatory sinus disease. No skull or  skullbase lesion.  IMPRESSION: 4 mm acute infarction at the posterior medial midportion of the right cerebellum. No other acute infarction.  Mild chronic small-vessel ischemic change affecting the cerebral hemispheric white matter.   Electronically Signed   By: Paulina Fusi M.D.   On: 08/20/2014 13:19   US Carotid Bilateral  08/21/2014   CLINICAL DATA:  70 year old female with recent right cerebellar infarct  EXAM: BILATERAL CAROTID DUPLEX ULTRASOUND  TECHNIQUE: Wallace Cullens scale imaging, color Doppler and duplex ultrasound were performed of bilateral carotid and vertebral arteries in the neck.  COMPARISON:  Brain MRI 08/20/2014; brain MRI 08/21/2014  FINDINGS: Criteria: Quantification of carotid stenosis is based on velocity parameters that correlate the residual internal carotid diameter with NASCET-based stenosis levels, using the diameter of the distal internal carotid lumen as the denominator for stenosis measurement.  The following velocity measurements were obtained:  RIGHT  ICA:  115/33 cm/sec  CCA:  100/22 cm/sec  SYSTOLIC ICA/CCA RATIO:  1.2  DIASTOLIC ICA/CCA RATIO:  1.5  ECA:  97 cm/sec  LEFT  ICA:  96/32 cm/sec  CCA:  116/27 cm/sec  SYSTOLIC ICA/CCA RATIO:   0.8  DIASTOLIC ICA/CCA RATIO:  1.2  ECA:  93 cm/sec  RIGHT CAROTID ARTERY: Borderline intimal medial thickening. No significant internal carotid artery plaque or evidence of stenosis.  RIGHT VERTEBRAL ARTERY:  Patent with normal antegrade flow.  LEFT CAROTID ARTERY: Intimal medial thickening. Small heterogeneous but smooth atherosclerotic plaque in the proximal internal carotid artery. By peak systolic velocity criteria the estimated stenosis remains less than 50%.  LEFT VERTEBRAL ARTERY:  Patent with normal antegrade flow.  IMPRESSION: 1. Mild (1-49%) stenosis proximal left internal carotid artery secondary to small volume heterogeneous but smooth atherosclerotic plaque. 2. Right distal carotid intimal medial thickening. No significant plaque or evidence of stenosis in the right internal carotid artery. 3. Vertebral arteries are patent with normal antegrade flow.  Signed,  Sterling Big, MD  [facility]  [facility]   Electronically Signed   By: Malachy Moan M.D.   On: 08/21/2014 09:38    CBC  Recent Labs Lab 08/20/14 1127  WBC 4.1  HGB 13.7  HCT 40.5  PLT 366  MCV 83.7  MCH 28.3  MCHC 33.8  RDW 13.0  LYMPHSABS 1.6  MONOABS 0.4  EOSABS 0.1  BASOSABS 0.1    Chemistries   Recent Labs Lab 08/20/14 1127  NA 139  K 3.3*  CL 105  CO2 26  GLUCOSE 196*  BUN 14  CREATININE 1.09*  CALCIUM 9.3  AST 20  ALT 23  ALKPHOS 42  BILITOT 0.6   ------------------------------------------------------------------------------------------------------------------ estimated creatinine clearance is 55 mL/min (by C-G formula based on Cr of 1.09). ------------------------------------------------------------------------------------------------------------------ No results for input(s): HGBA1C in the last 72 hours. ------------------------------------------------------------------------------------------------------------------  Recent Labs   08/21/14 0615  CHOL 208*  HDL 32*  LDLCALC 145*  TRIG 157*  CHOLHDL 6.5   ------------------------------------------------------------------------------------------------------------------ No results for input(s): TSH, T4TOTAL, T3FREE, THYROIDAB in the last 72 hours.  Invalid input(s): FREET3 ------------------------------------------------------------------------------------------------------------------ No results for input(s): VITAMINB12, FOLATE, FERRITIN, TIBC, IRON, RETICCTPCT in the last 72 hours.  Coagulation profile No results for input(s): INR, PROTIME in the last 168 hours.  No results for input(s): DDIMER in the last 72 hours.  Cardiac Enzymes No results for input(s): CKMB, TROPONINI, MYOGLOBIN in the last 168 hours.  Invalid input(s): CK ------------------------------------------------------------------------------------------------------------------ Invalid input(s): POCBNP    Keltin Baird D.O. on 08/21/2014 at 11:03 AM  Between 7am  to 7pm - Pager - [phone]  After 7pm go to [email or web address] - password [email or web address]  And look for the night coverage person covering for me after hours  [facility] Office  [phone]
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
## 2014-08-21 NOTE — Plan of Care (Signed)
Problem: Consults Goal: Ischemic Stroke Patient Education See Patient Education Module for education specifics.  Outcome: Completed/Met Date Met:  08/21/14 Stroke Mapping booklet explained and given to patient.

## 2014-08-22 DIAGNOSIS — I63011 Cerebral infarction due to thrombosis of right vertebral artery: Secondary | ICD-10-CM

## 2014-08-22 LAB — BASIC METABOLIC PANEL
Anion gap: 9 (ref 5–15)
BUN: 15 mg/dL (ref 6–20)
CALCIUM: 9.1 mg/dL (ref 8.9–10.3)
CO2: 26 mmol/L (ref 22–32)
Chloride: 105 mmol/L (ref 101–111)
Creatinine, Ser: 0.93 mg/dL (ref 0.44–1.00)
GLUCOSE: 147 mg/dL — AB (ref 65–99)
Potassium: 3.6 mmol/L (ref 3.5–5.1)
Sodium: 140 mmol/L (ref 135–145)

## 2014-08-22 LAB — GLUCOSE, CAPILLARY
Glucose-Capillary: 149 mg/dL — ABNORMAL HIGH (ref 65–99)
Glucose-Capillary: 169 mg/dL — ABNORMAL HIGH (ref 65–99)

## 2014-08-22 LAB — HEMOGLOBIN A1C
Hgb A1c MFr Bld: 6.3 % — ABNORMAL HIGH (ref 4.8–5.6)
MEAN PLASMA GLUCOSE: 134 mg/dL

## 2014-08-22 MED ORDER — ATORVASTATIN CALCIUM 20 MG PO TABS: 20.0000 mg | ORAL_TABLET | Freq: Every day | ORAL | Status: AC

## 2014-08-22 MED ORDER — ASPIRIN 325 MG PO TABS: 325.0000 mg | ORAL_TABLET | Freq: Every day | ORAL | Status: AC

## 2014-08-22 NOTE — Care Management Important Message (Signed)
Important Message  Patient Details  Name: Martha Ray MRN: [id number] Date of Birth: 12/09/1945   Medicare Important Message Given:  N/A - LOS <3 / Initial given by admissions    Cheryl Flash, RN 08/22/2014, 12:24 PM
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
## 2014-08-22 NOTE — Progress Notes (Signed)
OT Screen  Patient Details Name: Martha Ray MRN: [id number] DOB: December 19, 1945   OT Screen:      Reason evaluation not completed: Pt screened for OT needs. Pt demonstrates good BUE range of motion and 5/5 strength. Pt reports the diplopia is gradually resolving. Pt is independent in BADL tasks, pt took shower last night and made her bed. Pt is at baseline with BADL tasks, no further OT services required at this time.   Ezra Sites, OTR/L  [phone]  08/22/2014, 8:52 AM

## 2014-08-22 NOTE — Progress Notes (Signed)
Pt discharged home today per Dr. Ardyth Harps.  Pt's VSS.  Pt provided with home medication list, discharge instructions and prescriptions.  Verbalized understanding.  Pt left floor via WC in stable condition accompanied by NT.

## 2014-08-22 NOTE — Care Management Note (Signed)
Case Management Note  Patient Details  Name: ODETTA FORNESS MRN: [id number] Date of Birth: Jun 12, 1945  Subjective/Objective:                    Action/Plan:   Expected Discharge Date:                  Expected Discharge Plan:  Home/Self Care  In-House Referral:  NA  Discharge planning Services  CM Consult  Post Acute Care Choice:  NA Choice offered to:  NA  DME Arranged:    DME Agency:     HH Arranged:    HH Agency:     Status of Service:  Completed, signed off  Medicare Important Message Given:    Date Medicare IM Given:    Medicare IM give by:    Date Additional Medicare IM Given:    Additional Medicare Important Message give by:     If discussed at Long Length of Stay Meetings, dates discussed:    Additional Comments: Pt discharged home today. No CM needs noted. Arlyss Queen [place], RN 08/22/2014, 12:24 PM

## 2014-08-30 ENCOUNTER — Ambulatory Visit (HOSPITAL_COMMUNITY)
Admission: RE | Admit: 2014-08-30 | Discharge: 2014-08-30 | Disposition: A | Discharge status: Home / Self Care | Payer: Medicare Other | Source: Ambulatory Visit | Attending: Sports Medicine | Admitting: Sports Medicine

## 2014-08-30 DIAGNOSIS — Z1231 Encounter for screening mammogram for malignant neoplasm of breast: Secondary | ICD-10-CM | POA: Diagnosis not present

## 2015-08-12 ENCOUNTER — Other Ambulatory Visit (HOSPITAL_COMMUNITY): Payer: Self-pay | Admitting: Emergency Medicine

## 2015-08-12 DIAGNOSIS — Z1231 Encounter for screening mammogram for malignant neoplasm of breast: Secondary | ICD-10-CM

## 2015-09-04 ENCOUNTER — Ambulatory Visit (HOSPITAL_COMMUNITY)
Admission: RE | Admit: 2015-09-04 | Discharge: 2015-09-04 | Disposition: A | Discharge status: Home / Self Care | Payer: Medicare HMO | Source: Ambulatory Visit | Attending: Emergency Medicine | Admitting: Emergency Medicine

## 2015-09-04 DIAGNOSIS — Z1231 Encounter for screening mammogram for malignant neoplasm of breast: Secondary | ICD-10-CM | POA: Diagnosis not present

## 2016-07-28 ENCOUNTER — Other Ambulatory Visit (HOSPITAL_COMMUNITY): Payer: Self-pay | Admitting: Emergency Medicine

## 2016-07-28 DIAGNOSIS — Z1231 Encounter for screening mammogram for malignant neoplasm of breast: Secondary | ICD-10-CM

## 2016-09-07 ENCOUNTER — Ambulatory Visit (HOSPITAL_COMMUNITY): Payer: Medicare HMO

## 2016-10-08 ENCOUNTER — Ambulatory Visit (HOSPITAL_COMMUNITY): Payer: Medicare HMO

## 2017-03-23 ENCOUNTER — Encounter: Payer: Self-pay | Admitting: Gastroenterology

## 2017-04-15 ENCOUNTER — Ambulatory Visit (INDEPENDENT_AMBULATORY_CARE_PROVIDER_SITE_OTHER): Payer: Medicare HMO

## 2017-04-15 DIAGNOSIS — Z1211 Encounter for screening for malignant neoplasm of colon: Secondary | ICD-10-CM

## 2017-04-15 MED ORDER — PEG 3350-KCL-NA BICARB-NACL 420 G PO SOLR: 4000.0000 mL | ORAL | 0 refills | Status: DC

## 2017-04-15 NOTE — Patient Instructions (Signed)
Martha Ray   Mar 30, 1945 MRN: [id number]    Procedure Date: 06/11/17 Time to register: 11:00 Place to register: [facility] Procedure Time: 12:00 Scheduled provider: Barney Drain, MD  PREPARATION FOR COLONOSCOPY WITH TRI-LYTE SPLIT PREP  Please notify us immediately if you are diabetic, take iron supplements, or if you are on Coumadin or any other blood thinners.     You will need to purchase 1 fleet enema and 1 box of Bisacodyl 75m tablets.    1 DAY BEFORE PROCEDURE:  DATE: 06/10/17  DAY: Thursday Continue clear liquids the entire day - NO SOLID FOOD.   Diabetic medications adjustments for today: will mail a letter with this information   At 2:00 pm:  Take 2 Bisacodyl tablets.   At 4:00pm:  Start drinking your solution. Make sure you mix well per instructions on the bottle. Try to drink 1 (one) 8 ounce glass every 10-15 minutes until you have consumed HALF the jug. You should complete by 6:00pm.You must keep the left over solution refrigerated until completed next day.  Continue clear liquids. You must drink plenty of clear liquids to prevent dehyration and kidney failure. Nothing to eat or drink after midnight.  EXCEPTION: If you take medications for your heart, blood pressure or breathing, you may take these medications with a small amount of clear liquid.    DAY OF PROCEDURE:   DATE: 06/11/17  DAY: Friday  Diabetic medications adjustments for today: see letter  Five hours before your procedure time @ 7:00am:  Finish remaining amout of bowel prep, drinking 1 (one) 8 ounce glass every 10-15 minutes until complete. You have two hours to consume remaining prep.   Three hours before your procedure time @9 :00am:  Nothing by mouth.   At least one hour before going to the hospital:  Give yourself one Fleet enema. You may take your morning medications with sip of water unless we have instructed otherwise.      Please see below for Dietary Information.  CLEAR  LIQUIDS INCLUDE:  Water Jello (NOT red in color)   Ice Popsicles (NOT red in color)   Tea (sugar ok, no milk/cream) Powdered fruit flavored drinks  Coffee (sugar ok, no milk/cream) Gatorade/ Lemonade/ Kool-Aid  (NOT red in color)   Juice: apple, white grape, white cranberry Soft drinks  Clear bullion, consomme, broth (fat free beef/chicken/vegetable)  Carbonated beverages (any kind)  Strained chicken noodle soup Hard Candy   Remember: Clear liquids are liquids that will allow you to see your fingers on the other side of a clear glass. Be sure liquids are NOT red in color, and not cloudy, but CLEAR.  DO NOT EAT OR DRINK ANY OF THE FOLLOWING:  Dairy products of any kind   Cranberry juice Tomato juice / V8 juice   Grapefruit juice Orange juice     Red grape juice  Do not eat any solid foods, including such foods as: cereal, oatmeal, yogurt, fruits, vegetables, creamed soups, eggs, bread, crackers, pureed foods in a blender, etc.   HELPFUL HINTS FOR DRINKING PREP SOLUTION:   Make sure prep is extremely cold. Mix and refrigerate the the morning of the prep. You may also put in the freezer.   You may try mixing some Crystal Light or Country Time Lemonade if you prefer. Mix in small amounts; add more if necessary.  Try drinking through a straw  Rinse mouth with water or a mouthwash between glasses, to remove after-taste.  Try sipping on a  cold beverage /ice/ popsicles between glasses of prep.  Place a piece of sugar-free hard candy in mouth between glasses.  If you become nauseated, try consuming smaller amounts, or stretch out the time between glasses. Stop for 30-60 minutes, then slowly start back drinking.        OTHER INSTRUCTIONS  You will need a responsible adult at least 71 years of age to accompany you and drive you home. This person must remain in the waiting room during your procedure. The hospital will cancel your procedure if you do not have a responsible adult with  you.   1. Wear loose fitting clothing that is easily removed. 2. Leave jewelry and other valuables at home.  3. Remove all body piercing jewelry and leave at home. 4. Total time from sign-in until discharge is approximately 2-3 hours. 5. You should go home directly after your procedure and rest. You can resume normal activities the day after your procedure. 6. The day of your procedure you should not:  Drive  Make legal decisions  Operate machinery  Drink alcohol  Return to work   You may call the office (Dept: [phone]) before 5:00pm, or page the doctor on call [phone]) after 5:00pm, for further instructions, if necessary.   Insurance Information YOU WILL NEED TO CHECK WITH YOUR INSURANCE COMPANY FOR THE BENEFITS OF COVERAGE YOU HAVE FOR THIS PROCEDURE.  UNFORTUNATELY, NOT ALL INSURANCE COMPANIES HAVE BENEFITS TO COVER ALL OR PART OF THESE TYPES OF PROCEDURES.  IT IS YOUR RESPONSIBILITY TO CHECK YOUR BENEFITS, HOWEVER, WE WILL BE GLAD TO ASSIST YOU WITH ANY CODES YOUR INSURANCE COMPANY MAY NEED.    PLEASE NOTE THAT MOST INSURANCE COMPANIES WILL NOT COVER A SCREENING COLONOSCOPY FOR PEOPLE UNDER THE AGE OF 50  IF YOU HAVE BCBS INSURANCE, YOU MAY HAVE BENEFITS FOR A SCREENING COLONOSCOPY BUT IF POLYPS ARE FOUND THE DIAGNOSIS WILL CHANGE AND THEN YOU MAY HAVE A DEDUCTIBLE THAT WILL NEED TO BE MET. SO PLEASE MAKE SURE YOU CHECK YOUR BENEFITS FOR A SCREENING COLONOSCOPY AS WELL AS A DIAGNOSTIC COLONOSCOPY.

## 2017-04-15 NOTE — Progress Notes (Signed)
Gastroenterology Pre-Procedure Review  Request Date:04/15/17 Requesting Physician: 10 year recall  Last tcs 04/29/07 SL[facility] diverticula and hemorrhoids, no polyps,   PATIENT REVIEW QUESTIONS: The patient responded to the following health history questions as indicated:    1. Diabetes Melitis: yes metformin 2. Joint replacements in the past 12 months: no 3. Major health problems in the past 3 months: no 4. Has an artificial valve or MVP: no 5. Has a defibrillator: no 6. Has been advised in past to take antibiotics in advance of a procedure like teeth cleaning: no 7. Family history of colon cancer: no  8. Alcohol Use: no 9. History of sleep apnea: no  10. History of coronary artery or other vascular stents placed within the last 12 months: no 11. History of any prior anesthesia complications: no    MEDICATIONS & ALLERGIES:    Patient reports the following regarding taking any blood thinners:   Plavix? no Aspirin? yes (325mg ) Coumadin? no Brilinta? no Xarelto? no Eliquis? no Pradaxa? no Savaysa? no Effient? no  Patient confirms/reports the following medications:  Current Outpatient Medications  Medication Sig Dispense Refill  . aspirin 325 MG tablet Take 1 tablet (325 mg total) by mouth daily.    Marland Kitchen. atorvastatin (LIPITOR) 20 MG tablet Take 1 tablet (20 mg total) by mouth daily at 6 PM. 30 tablet 2  . co-enzyme Q-10 50 MG capsule Take 50 mg by mouth daily.    Marland Kitchen. losartan-hydrochlorothiazide (HYZAAR) 50-12.5 MG per tablet Take 1 tablet by mouth daily.    . metFORMIN (GLUCOPHAGE) 500 MG tablet Take 500 mg by mouth daily with breakfast.     No current facility-administered medications for this visit.     Patient confirms/reports the following allergies:  No Known Allergies  No orders of the defined types were placed in this encounter.   AUTHORIZATION INFORMATION Primary Insurance: [employer] medicare,  ID #: [id number] Pre-Cert / Auth required: no    SCHEDULE INFORMATION: Procedure  has been scheduled as follows:  Date: 06/11/17, Time: 12:00 Location:[facility]  This Gastroenterology Pre-Precedure Review Form is being routed to the following provider(s): Tana CoastLeslie Lewis, PA

## 2017-04-25 NOTE — Progress Notes (Signed)
Ok to schedule.  Day of bowel prep: 1/2 dose metformin Am of tcs: hold metformin

## 2017-04-27 NOTE — Progress Notes (Signed)
Letter mailed to the pt with DM medication instructions.  

## 2017-06-11 ENCOUNTER — Ambulatory Visit (HOSPITAL_COMMUNITY)
Admission: RE | Admit: 2017-06-11 | Discharge: 2017-06-11 | Disposition: A | Discharge status: Home / Self Care | Payer: Medicare HMO | Source: Ambulatory Visit | Attending: Gastroenterology | Admitting: Gastroenterology

## 2017-06-11 ENCOUNTER — Encounter (HOSPITAL_COMMUNITY): Payer: Self-pay | Admitting: *Deleted

## 2017-06-11 ENCOUNTER — Encounter (HOSPITAL_COMMUNITY)
Admission: RE | Disposition: A | Discharge status: Home / Self Care | Payer: Self-pay | Source: Ambulatory Visit | Attending: Gastroenterology

## 2017-06-11 ENCOUNTER — Other Ambulatory Visit: Payer: Self-pay

## 2017-06-11 DIAGNOSIS — E119 Type 2 diabetes mellitus without complications: Secondary | ICD-10-CM | POA: Diagnosis not present

## 2017-06-11 DIAGNOSIS — K648 Other hemorrhoids: Secondary | ICD-10-CM | POA: Diagnosis not present

## 2017-06-11 DIAGNOSIS — Z7984 Long term (current) use of oral hypoglycemic drugs: Secondary | ICD-10-CM | POA: Diagnosis not present

## 2017-06-11 DIAGNOSIS — K644 Residual hemorrhoidal skin tags: Secondary | ICD-10-CM | POA: Insufficient documentation

## 2017-06-11 DIAGNOSIS — Z79899 Other long term (current) drug therapy: Secondary | ICD-10-CM | POA: Diagnosis not present

## 2017-06-11 DIAGNOSIS — K5730 Diverticulosis of large intestine without perforation or abscess without bleeding: Secondary | ICD-10-CM | POA: Diagnosis not present

## 2017-06-11 DIAGNOSIS — Z809 Family history of malignant neoplasm, unspecified: Secondary | ICD-10-CM | POA: Diagnosis not present

## 2017-06-11 DIAGNOSIS — Z7982 Long term (current) use of aspirin: Secondary | ICD-10-CM | POA: Diagnosis not present

## 2017-06-11 DIAGNOSIS — I10 Essential (primary) hypertension: Secondary | ICD-10-CM | POA: Insufficient documentation

## 2017-06-11 DIAGNOSIS — Z1211 Encounter for screening for malignant neoplasm of colon: Secondary | ICD-10-CM | POA: Insufficient documentation

## 2017-06-11 HISTORY — DX: Type 2 diabetes mellitus without complications: E11.9

## 2017-06-11 HISTORY — PX: COLONOSCOPY: SHX5424

## 2017-06-11 SURGERY — COLONOSCOPY
Anesthesia: Moderate Sedation

## 2017-06-11 MED ORDER — MEPERIDINE HCL 100 MG/ML IJ SOLN
INTRAMUSCULAR | Status: AC
  Filled 2017-06-11: qty 2

## 2017-06-11 MED ORDER — MEPERIDINE HCL 100 MG/ML IJ SOLN
INTRAMUSCULAR | Status: DC | PRN
  Administered 2017-06-11: 50 mg via INTRAVENOUS
  Administered 2017-06-11: 25 mg via INTRAVENOUS

## 2017-06-11 MED ORDER — SODIUM CHLORIDE 0.9 % IV SOLN
INTRAVENOUS | Status: DC
  Administered 2017-06-11: 11:00:00 via INTRAVENOUS

## 2017-06-11 MED ORDER — STERILE WATER FOR IRRIGATION IR SOLN
Status: DC | PRN
  Administered 2017-06-11: 2.5 mL

## 2017-06-11 MED ORDER — MIDAZOLAM HCL 5 MG/5ML IJ SOLN
INTRAMUSCULAR | Status: DC | PRN
  Administered 2017-06-11: 1 mg via INTRAVENOUS
  Administered 2017-06-11 (×2): 2 mg via INTRAVENOUS

## 2017-06-11 MED ORDER — MIDAZOLAM HCL 5 MG/5ML IJ SOLN
INTRAMUSCULAR | Status: AC
  Filled 2017-06-11: qty 10

## 2017-06-11 NOTE — H&P (Signed)
Primary Care Physician:  [facility] Primary Gastroenterologist:  Dr. Darrick Penna  Pre-Procedure History & Physical: HPI:  Martha Ray is a 72 y.o. female here for COLON CANCER SCREENING.  Past Medical History:  Diagnosis Date  . Diabetes mellitus without complication (HCC)   . Hypertension     Past Surgical History:  Procedure Laterality Date  . None    . TUBAL LIGATION      Prior to Admission medications   Medication Sig Start Date End Date Taking? Authorizing Provider  aspirin 325 MG tablet Take 1 tablet (325 mg total) by mouth daily. 08/22/14  Yes Philip Aspen, Limmie Patricia, MD  atorvastatin (LIPITOR) 20 MG tablet Take 1 tablet (20 mg total) by mouth daily at 6 PM. 08/22/14  Yes Philip Aspen, Limmie Patricia, MD  co-enzyme Q-10 50 MG capsule Take 50 mg by mouth daily.   Yes [provider]  losartan-hydrochlorothiazide (HYZAAR) 50-12.5 MG per tablet Take 1 tablet by mouth daily.   Yes [provider]  metFORMIN (GLUCOPHAGE) 500 MG tablet Take 500 mg by mouth 2 (two) times daily with a meal.    Yes [provider]  polyethylene glycol-electrolytes (TRILYTE) 420 g solution Take 4,000 mLs by mouth as directed. 04/15/17  Yes Tiffany Kocher, PA-C    Allergies as of 04/15/2017  . (No Known Allergies)    Family History  Problem Relation Age of Onset  . Cancer Mother   . Colon cancer Neg Hx   . Colon polyps Neg Hx     Social History   Socioeconomic History  . Marital status: Married    Spouse name: Not on file  . Number of children: Not on file  . Years of education: Not on file  . Highest education level: Not on file  Occupational History  . Not on file  Social Needs  . Financial resource strain: Not on file  . Food insecurity:    Worry: Not on file    Inability: Not on file  . Transportation needs:    Medical: Not on file    Non-medical: Not on file  Tobacco Use  . Smoking status: Never Smoker  . Smokeless  tobacco: Never Used  Substance and Sexual Activity  . Alcohol use: No  . Drug use: No  . Sexual activity: Not on file  Lifestyle  . Physical activity:    Days per week: Not on file    Minutes per session: Not on file  . Stress: Not on file  Relationships  . Social connections:    Talks on phone: Not on file    Gets together: Not on file    Attends religious service: Not on file    Active member of club or organization: Not on file    Attends meetings of clubs or organizations: Not on file    Relationship status: Not on file  . Intimate partner violence:    Fear of current or ex partner: Not on file    Emotionally abused: Not on file    Physically abused: Not on file    Forced sexual activity: Not on file  Other Topics Concern  . Not on file  Social History Narrative  . Not on file    Review of Systems: See HPI, otherwise negative ROS   Physical Exam: BP (!) 155/82   Temp 98 F (36.7 C) (Oral)   Resp 16   Ht  (1.651 m)   Wt 180  lb (81.6 kg)   SpO2 98%   BMI 29.95 kg/m  General:   Alert,  pleasant and cooperative in NAD Head:  Normocephalic and atraumatic. Neck:  Supple; Lungs:  Clear throughout to auscultation.    Heart:  Regular rate and rhythm. Abdomen:  Soft, nontender and nondistended. Normal bowel sounds, without guarding, and without rebound.   Neurologic:  Alert and  oriented x4;  grossly normal neurologically.  Impression/Plan:     SCREENING  Plan:  1. TCS TODAY. DISCUSSED PROCEDURE, BENEFITS, & RISKS: < 1% chance of medication reaction, bleeding, perforation, or rupture of spleen/liver.

## 2017-06-11 NOTE — Op Note (Signed)
[facility] Patient Name: Martha Ray Procedure Date: 06/11/2017 11:02 AM MRN: [id number] Date of Birth: 1946-01-22 Attending MD: Jonette Eva MD, MD CSN: [id number] Age: 72 Admit Type: Outpatient Procedure:                Colonoscopy, SCREENING Indications:              Screening for colorectal malignant neoplasm Providers:                Jonette Eva MD, MD, Nena Polio, RN, Burke Keels, Technician Referring MD:              Medicines:                Meperidine 75 mg IV, Midazolam 5 mg IV Complications:            No immediate complications. Estimated Blood Loss:     Estimated blood loss: none. Procedure:                Pre-Anesthesia Assessment:                           - Prior to the procedure, a History and Physical                            was performed, and patient medications and                            allergies were reviewed. The patient's tolerance of                            previous anesthesia was also reviewed. The risks                            and benefits of the procedure and the sedation                            options and risks were discussed with the patient.                            All questions were answered, and informed consent                            was obtained. Prior Anticoagulants: The patient has                            taken aspirin, last dose was 7 days prior to                            procedure. ASA Grade Assessment: II - A patient                            with mild systemic disease. After reviewing the  risks and benefits, the patient was deemed in                            satisfactory condition to undergo the procedure.                            After obtaining informed consent, the colonoscope                            was passed under direct vision. Throughout the                            procedure, the patient's blood pressure, pulse, and                             oxygen saturations were monitored continuously. The                            EC38-[id number]) scope was introduced through                            the anus and advanced to the the cecum, identified                            by appendiceal orifice and ileocecal valve. The                            colonoscopy was technically difficult and complex                            due to significant looping. Successful completion                            of the procedure was aided by increasing the dose                            of sedation medication, straightening and                            shortening the scope to obtain bowel loop reduction                            and COLOWRAP. The patient tolerated the procedure                            fairly well. The quality of the bowel preparation                            was good. The ileocecal valve, appendiceal orifice,                            and rectum were photographed. Scope In: 11:28:14 AM Scope Out: 11:44:21 AM Scope Withdrawal Time: 0 hours 9 minutes 20 seconds  Total Procedure Duration: 0 hours 16 minutes 7 seconds  Findings:      Multiple small and large-mouthed diverticula were found in the       recto-sigmoid colon, sigmoid colon, descending colon, splenic flexure       and transverse colon.      The recto-sigmoid colon, sigmoid colon and descending colon revealed       significantly excessive looping.      External and internal hemorrhoids were found during retroflexion. The       hemorrhoids were small. Impression:               - MODERATE Diverticulosis in the recto-sigmoid                            colon, in the sigmoid colon, in the descending                            colon, at the splenic flexure and in the transverse                            colon.                           - There was significant looping of the colon.                           - External and internal  hemorrhoids. Moderate Sedation:      Moderate (conscious) sedation was administered by the endoscopy nurse       and supervised by the endoscopist. The following parameters were       monitored: oxygen saturation, heart rate, blood pressure, and response       to care. Total physician intraservice time was 28 minutes. Recommendation:           - High fiber diet.                           - Continue present medications. LOSE WEIGHT.                           - Repeat colonoscopy is not recommended due to                            current age (53 years or older) for surveillance.                           - Patient has a contact number available for                            emergencies. The signs and symptoms of potential                            delayed complications were discussed with the                            patient. Return to normal activities tomorrow.  Written discharge instructions were provided to the                            patient. Procedure Code(s):        --- Professional ---                           [id number], Colonoscopy, flexible; diagnostic, including                            collection of specimen(s) by brushing or washing,                            when performed (separate procedure)                           G0500, Moderate sedation services provided by the                            same physician or other qualified health care                            professional performing a gastrointestinal                            endoscopic service that sedation supports,                            requiring the presence of an independent trained                            observer to assist in the monitoring of the                            patient's level of consciousness and physiological                            status; initial 15 minutes of intra-service time;                            patient age 63 years or older (additional  time may                            be reported with [id number], as appropriate)                           [phone], Moderate sedation services provided by the                            same physician or other qualified health care                            professional performing the diagnostic or  therapeutic service that the sedation supports,                            requiring the presence of an independent trained                            observer to assist in the monitoring of the                            patient's level of consciousness and physiological                            status; each additional 15 minutes intraservice                            time (List separately in addition to code for                            primary service) Diagnosis Code(s):        --- Professional ---                           Z12.11, Encounter for screening for malignant                            neoplasm of colon                           K64.8, Other hemorrhoids                           K57.30, Diverticulosis of large intestine without                            perforation or abscess without bleeding CPT copyright 2017 [employer] Association. All rights reserved. The codes documented in this report are preliminary and upon coder review may  be revised to meet current compliance requirements. Jonette Eva, MD Jonette Eva MD, MD 06/11/2017 11:58:59 AM This report has been signed electronically. Number of Addenda: 0

## 2017-06-11 NOTE — Discharge Instructions (Signed)
You have small internal  AND EXTERNAL hemorrhoids and diverticulosis IN YOUR LEFT AND RIGHT COLON.    DRINK WATER TO KEEP YOUR URINE LIGHT YELLOW.  KEEP YOUR BODY MASS INDEX UNDER 30 WHICH MEANS YOU ARE OBESE. IT IS CURRENTLY 29.95. OBESITY IS ASSOCIATED WITH AN INCREASED FOR ALL CANCERS, INCLUDING ESOPHAGEAL AND COLON CANCER. A WEIGHT OF 180 LBS OR LESS  WILL KEEP YOUR BODY MASS INDEX(BMI) UNDER 30.  FOLLOW A HIGH FIBER DIET. AVOID ITEMS THAT CAUSE BLOATING. See info below.  USE PREPARATION H FOUR TIMES  A DAY IF NEEDED TO RELIEVE RECTAL PAIN/PRESSURE/BLEEDING.  We do not routinely screen for polyps after the age of 47. HOWEVER I WOULD CONSIDER A colonoscopy in 10-15 years IF THE BENEFITS OUTWEIGH THE RISKS.  Colonoscopy Care After Read the instructions outlined below and refer to this sheet in the next week. These discharge instructions provide you with general information on caring for yourself after you leave the hospital. While your treatment has been planned according to the most current medical practices available, unavoidable complications occasionally occur. If you have any problems or questions after discharge, call DR. Shakeem Stern, [phone].  ACTIVITY You may resume your regular activity, but move at a slower pace for the next 24 hours.  Take frequent rest periods for the next 24 hours.  Walking will help get rid of the air and reduce the bloated feeling in your belly (abdomen).  No driving for 24 hours (because of the medicine (anesthesia) used during the test).  You may shower.  Do not sign any important legal documents or operate any machinery for 24 hours (because of the anesthesia used during the test).   NUTRITION Drink plenty of fluids.  You may resume your normal diet as instructed by your doctor.  Begin with a light meal and progress to your normal diet. Heavy or fried foods are harder to digest and may make you feel sick to your stomach (nauseated).  Avoid alcoholic  beverages for 24 hours or as instructed.   MEDICATIONS You may resume your normal medications.  WHAT YOU CAN EXPECT TODAY Some feelings of bloating in the abdomen.  Passage of more gas than usual.  Spotting of blood in your stool or on the toilet paper .  IF YOU HAD POLYPS REMOVED DURING THE COLONOSCOPY: Eat a soft diet IF YOU HAVE NAUSEA, BLOATING, ABDOMINAL PAIN, OR VOMITING.   FINDING OUT THE RESULTS OF YOUR TEST Not all test results are available during your visit. DR. Darrick Penna WILL CALL YOU WITHIN 7 DAYS OF YOUR PROCEDUE WITH YOUR RESULTS. Do not assume everything is normal if you have not heard from DR. Dorie Ohms IN ONE WEEK, CALL HER OFFICE AT [phone].  SEEK IMMEDIATE MEDICAL ATTENTION AND CALL THE OFFICE: [phone] IF: You have more than a spotting of blood in your stool.  Your belly is swollen (abdominal distention).  You are nauseated or vomiting.  You have a temperature over 101F.  You have abdominal pain or discomfort that is severe or gets worse throughout the day.  High-Fiber Diet A high-fiber diet changes your normal diet to include more whole grains, legumes, fruits, and vegetables. Changes in the diet involve replacing refined carbohydrates with unrefined foods. The calorie level of the diet is essentially unchanged. The Dietary Reference Intake (recommended amount) for adult males is 38 grams per day. For adult females, it is 25 grams per day. Pregnant and lactating women should consume 28 grams of fiber per day. Fiber is the intact  part of a plant that is not broken down during digestion. Functional fiber is fiber that has been isolated from the plant to provide a beneficial effect in the body. PURPOSE Increase stool bulk.  Ease and regulate bowel movements.  Lower cholesterol.  INDICATIONS THAT YOU NEED MORE FIBER Constipation and hemorrhoids.  Uncomplicated diverticulosis (intestine condition) and irritable bowel syndrome.  Weight management.  As a protective  measure against hardening of the arteries (atherosclerosis), diabetes, and cancer.   GUIDELINES FOR INCREASING FIBER IN THE DIET Start adding fiber to the diet slowly. A gradual increase of about 5 more grams (2 slices of whole-wheat bread, 2 servings of most fruits or vegetables, or 1 bowl of high-fiber cereal) per day is best. Too rapid an increase in fiber may result in constipation, flatulence, and bloating.  Drink enough water and fluids to keep your urine clear or pale yellow. Water, juice, or caffeine-free drinks are recommended. Not drinking enough fluid may cause constipation.  Eat a variety of high-fiber foods rather than one type of fiber.  Try to increase your intake of fiber through using high-fiber foods rather than fiber pills or supplements that contain small amounts of fiber.  The goal is to change the types of food eaten. Do not supplement your present diet with high-fiber foods, but replace foods in your present diet.   INCLUDE A VARIETY OF FIBER SOURCES Replace refined and processed grains with whole grains, canned fruits with fresh fruits, and incorporate other fiber sources. White rice, white breads, and most bakery goods contain little or no fiber.  Brown whole-grain rice, buckwheat oats, and many fruits and vegetables are all good sources of fiber. These include: broccoli, Brussels sprouts, cabbage, cauliflower, beets, sweet potatoes, white potatoes (skin on), carrots, tomatoes, eggplant, squash, berries, fresh fruits, and dried fruits.  Cereals appear to be the richest source of fiber. Cereal fiber is found in whole grains and bran. Bran is the fiber-rich outer coat of cereal grain, which is largely removed in refining. In whole-grain cereals, the bran remains. In breakfast cereals, the largest amount of fiber is found in those with "bran" in their names. The fiber content is sometimes indicated on the label.  You may need to include additional fruits and vegetables each day.    In baking, for 1 cup white flour, you may use the following substitutions:  1 cup whole-wheat flour minus 2 tablespoons.  1/2 cup white flour plus 1/2 cup whole-wheat flour.   Diverticulosis Diverticulosis is a common condition that develops when small pouches (diverticula) form in the wall of the colon. The risk of diverticulosis increases with age. It happens more often in people who eat a low-fiber diet. Most individuals with diverticulosis have no symptoms. Those individuals with symptoms usually experience belly (abdominal) pain, constipation, or loose stools (diarrhea).  HOME CARE INSTRUCTIONS Increase the amount of fiber in your diet as directed by your caregiver or dietician. This may reduce symptoms of diverticulosis.  Drink at least 6 to 8 glasses of water each day to prevent constipation.  Try not to strain when you have a bowel movement.  Avoiding nuts and seeds to prevent complications is NOT NECESSARY.   FOODS HAVING HIGH FIBER CONTENT INCLUDE: Fruits. Apple, peach, pear, tangerine, raisins, prunes.  Vegetables. Brussels sprouts, asparagus, broccoli, cabbage, carrot, cauliflower, romaine lettuce, spinach, summer squash, tomato, winter squash, zucchini.  Starchy Vegetables. Baked beans, kidney beans, lima beans, split peas, lentils, potatoes (with skin).  Grains. Whole wheat bread, brown rice,  bran flake cereal, plain oatmeal, white rice, shredded wheat, bran muffins.   SEEK IMMEDIATE MEDICAL CARE IF: You develop increasing pain or severe bloating.  You have an oral temperature above 101F.  You develop vomiting or bowel movements that are bloody or black.   Hemorrhoids Hemorrhoids are dilated (enlarged) veins around the rectum. Sometimes clots will form in the veins. This makes them swollen and painful. These are called thrombosed hemorrhoids. Causes of hemorrhoids include: Constipation.  Straining to have a bowel movement.  HEAVY LIFTING  HOME CARE INSTRUCTIONS Eat a  well balanced diet and drink 6 to 8 glasses of water every day to avoid constipation. You may also use a bulk laxative.  Avoid straining to have bowel movements.  Keep anal area dry and clean.  Do not use a donut shaped pillow or sit on the toilet for long periods. This increases blood pooling and pain.  Move your bowels when your body has the urge; this will require less straining and will decrease pain and pressure.

## 2017-06-15 ENCOUNTER — Encounter (HOSPITAL_COMMUNITY): Payer: Self-pay | Admitting: Gastroenterology

## 2018-02-17 ENCOUNTER — Other Ambulatory Visit (HOSPITAL_COMMUNITY): Payer: Self-pay | Admitting: Emergency Medicine

## 2018-02-17 DIAGNOSIS — Z1231 Encounter for screening mammogram for malignant neoplasm of breast: Secondary | ICD-10-CM

## 2018-03-03 ENCOUNTER — Encounter (HOSPITAL_COMMUNITY): Payer: Self-pay

## 2018-03-03 ENCOUNTER — Ambulatory Visit (HOSPITAL_COMMUNITY)
Admission: RE | Admit: 2018-03-03 | Discharge: 2018-03-03 | Disposition: A | Discharge status: Home / Self Care | Payer: Medicare HMO | Source: Ambulatory Visit | Attending: Emergency Medicine | Admitting: Emergency Medicine

## 2018-03-03 DIAGNOSIS — Z1231 Encounter for screening mammogram for malignant neoplasm of breast: Secondary | ICD-10-CM | POA: Diagnosis present

## 2019-09-19 ENCOUNTER — Ambulatory Visit: Payer: Self-pay | Admitting: "Endocrinology

## 2019-12-11 LAB — BASIC METABOLIC PANEL
BUN: 12 (ref 4–21)
Creatinine: 1.1 (ref 0.5–1.1)

## 2019-12-11 LAB — COMPREHENSIVE METABOLIC PANEL
GFR calc Af Amer: 58
GFR calc non Af Amer: 50

## 2019-12-11 LAB — HEMOGLOBIN A1C: Hemoglobin A1C: 13.5

## 2019-12-11 LAB — MICROALBUMIN, URINE: Microalb, Ur: 0.9

## 2020-02-04 ENCOUNTER — Encounter (HOSPITAL_COMMUNITY): Payer: Self-pay | Admitting: *Deleted

## 2020-02-04 ENCOUNTER — Emergency Department (HOSPITAL_COMMUNITY): Payer: Medicare HMO

## 2020-02-04 ENCOUNTER — Other Ambulatory Visit: Payer: Self-pay

## 2020-02-04 ENCOUNTER — Emergency Department (HOSPITAL_COMMUNITY)
Admission: EM | Admit: 2020-02-04 | Discharge: 2020-02-04 | Disposition: A | Discharge status: Home / Self Care | Payer: Medicare HMO | Attending: Emergency Medicine | Admitting: Emergency Medicine

## 2020-02-04 DIAGNOSIS — K429 Umbilical hernia without obstruction or gangrene: Secondary | ICD-10-CM | POA: Diagnosis not present

## 2020-02-04 DIAGNOSIS — I10 Essential (primary) hypertension: Secondary | ICD-10-CM | POA: Diagnosis not present

## 2020-02-04 DIAGNOSIS — Z7982 Long term (current) use of aspirin: Secondary | ICD-10-CM | POA: Diagnosis not present

## 2020-02-04 DIAGNOSIS — Z79899 Other long term (current) drug therapy: Secondary | ICD-10-CM | POA: Insufficient documentation

## 2020-02-04 DIAGNOSIS — Z8673 Personal history of transient ischemic attack (TIA), and cerebral infarction without residual deficits: Secondary | ICD-10-CM | POA: Diagnosis not present

## 2020-02-04 DIAGNOSIS — Z20822 Contact with and (suspected) exposure to covid-19: Secondary | ICD-10-CM | POA: Insufficient documentation

## 2020-02-04 DIAGNOSIS — R739 Hyperglycemia, unspecified: Secondary | ICD-10-CM

## 2020-02-04 DIAGNOSIS — E1165 Type 2 diabetes mellitus with hyperglycemia: Secondary | ICD-10-CM | POA: Diagnosis not present

## 2020-02-04 LAB — URINALYSIS, ROUTINE W REFLEX MICROSCOPIC
Bilirubin Urine: NEGATIVE
Glucose, UA: >=500 mg/dL — AB
Hgb urine dipstick: NEGATIVE
Ketones, ur: 5 mg/dL — AB
Nitrite: NEGATIVE
Protein, ur: NEGATIVE mg/dL
Specific Gravity, Urine: 1.02 (ref 1.005–1.030)
pH: 5 (ref 5.0–8.0)

## 2020-02-04 LAB — I-STAT CHEM 8, ED
BUN: 24 mg/dL — ABNORMAL HIGH (ref 8–23)
Calcium, Ion: 1.19 mmol/L (ref 1.15–1.40)
Chloride: 90 mmol/L — ABNORMAL LOW (ref 98–111)
Creatinine, Ser: 1.8 mg/dL — ABNORMAL HIGH (ref 0.44–1.00)
Glucose, Bld: 596 mg/dL (ref 70–99)
HCT: 46 % (ref 36.0–46.0)
Hemoglobin: 15.6 g/dL — ABNORMAL HIGH (ref 12.0–15.0)
Potassium: 4.3 mmol/L (ref 3.5–5.1)
Sodium: 130 mmol/L — ABNORMAL LOW (ref 135–145)
TCO2: 27 mmol/L (ref 22–32)

## 2020-02-04 LAB — BASIC METABOLIC PANEL
Anion gap: 10 (ref 5–15)
Anion gap: 14 (ref 5–15)
BUN: 65 mg/dL — ABNORMAL HIGH (ref 8–23)
BUN: 23 mg/dL (ref 8–23)
CO2: 25 mmol/L (ref 22–32)
CO2: 25 mmol/L (ref 22–32)
Calcium: 8.9 mg/dL (ref 8.9–10.3)
Calcium: 9.2 mg/dL (ref 8.9–10.3)
Chloride: 100 mmol/L (ref 98–111)
Chloride: 93 mmol/L — ABNORMAL LOW (ref 98–111)
Creatinine, Ser: 3.65 mg/dL — ABNORMAL HIGH (ref 0.44–1.00)
Creatinine, Ser: 1.72 mg/dL — ABNORMAL HIGH (ref 0.44–1.00)
GFR, Estimated: 13 mL/min — ABNORMAL LOW (ref >60)
GFR, Estimated: 31 mL/min — ABNORMAL LOW (ref >60)
Glucose, Bld: 126 mg/dL — ABNORMAL HIGH (ref 70–99)
Glucose, Bld: 505 mg/dL (ref 70–99)
Potassium: 4.5 mmol/L (ref 3.5–5.1)
Potassium: 4.4 mmol/L (ref 3.5–5.1)
Sodium: 135 mmol/L (ref 135–145)
Sodium: 132 mmol/L — ABNORMAL LOW (ref 135–145)

## 2020-02-04 LAB — CBC WITH DIFFERENTIAL/PLATELET
Abs Immature Granulocytes: 0.03 10*3/uL (ref 0.00–0.07)
Basophils Absolute: 0.1 10*3/uL (ref 0.0–0.1)
Basophils Relative: 1 %
Eosinophils Absolute: 0.1 10*3/uL (ref 0.0–0.5)
Eosinophils Relative: 2 %
HCT: 42 % (ref 36.0–46.0)
Hemoglobin: 13.9 g/dL (ref 12.0–15.0)
Immature Granulocytes: 0 %
Lymphocytes Relative: 32 %
Lymphs Abs: 2.7 10*3/uL (ref 0.7–4.0)
MCH: 27.9 pg (ref 26.0–34.0)
MCHC: 33.1 g/dL (ref 30.0–36.0)
MCV: 84.3 fL (ref 80.0–100.0)
Monocytes Absolute: 0.7 10*3/uL (ref 0.1–1.0)
Monocytes Relative: 9 %
Neutro Abs: 4.7 10*3/uL (ref 1.7–7.7)
Neutrophils Relative %: 56 %
Platelets: 363 10*3/uL (ref 150–400)
RBC: 4.98 MIL/uL (ref 3.87–5.11)
RDW: 11.9 % (ref 11.5–15.5)
WBC: 8.3 10*3/uL (ref 4.0–10.5)
nRBC: 0 % (ref 0.0–0.2)

## 2020-02-04 LAB — CBC
HCT: 31 % — ABNORMAL LOW (ref 36.0–46.0)
Hemoglobin: 10.3 g/dL — ABNORMAL LOW (ref 12.0–15.0)
MCH: 28.9 pg (ref 26.0–34.0)
MCHC: 33.2 g/dL (ref 30.0–36.0)
MCV: 87.1 fL (ref 80.0–100.0)
Platelets: 365 10*3/uL (ref 150–400)
RBC: 3.56 MIL/uL — ABNORMAL LOW (ref 3.87–5.11)
RDW: 15.6 % — ABNORMAL HIGH (ref 11.5–15.5)
WBC: 19.1 10*3/uL — ABNORMAL HIGH (ref 4.0–10.5)
nRBC: 0 % (ref 0.0–0.2)

## 2020-02-04 LAB — CBG MONITORING, ED
Glucose-Capillary: 599 mg/dL (ref 70–99)
Glucose-Capillary: >600 mg/dL (ref 70–99)
Glucose-Capillary: 465 mg/dL — ABNORMAL HIGH (ref 70–99)
Glucose-Capillary: 443 mg/dL — ABNORMAL HIGH (ref 70–99)
Glucose-Capillary: 394 mg/dL — ABNORMAL HIGH (ref 70–99)

## 2020-02-04 MED ORDER — SODIUM CHLORIDE 0.9 % IV BOLUS
1000.0000 mL | Freq: Once | INTRAVENOUS | Status: AC
  Administered 2020-02-04: 1000 mL via INTRAVENOUS

## 2020-02-04 MED ORDER — INSULIN ASPART 100 UNIT/ML ~~LOC~~ SOLN
4.0000 [IU] | Freq: Once | SUBCUTANEOUS | Status: AC
  Administered 2020-02-04: 4 [IU] via INTRAVENOUS
  Filled 2020-02-04: qty 1

## 2020-02-04 NOTE — Discharge Instructions (Signed)
Your blood sugar was sligthly high today but your work up did not show DKA. As we discussed this needs to be addressed with your primary care doctor to see if your medications need ot be adjusted.   Patient, as we discussed, your CT scan showed evidence of uterine fibroids.  There is also some discoloration on the liver.  This did not look suspicious but may require further evaluation with an MRI.  Please follow-up with your primary care doctor regarding this.  You have a COVID test pending.  Return to emergency department for any nausea/vomiting, chest pain, difficulty breathing, abdominal pain, fevers or any other worsening concerning symptoms.

## 2020-02-04 NOTE — ED Provider Notes (Signed)
[facility] EMERGENCY DEPARTMENT Provider Note   CSN: [id number] Arrival date & time: 02/04/20  1144     History Chief Complaint  Patient presents with  . Hyperglycemia    Martha Ray is a 75 y.o. female with PMH/o DM, HTN who presents for evaluation of fatigue, decreased appetite.  She reports that she has not felt well "for a while."  She thinks it has been about 4 to 6 weeks.  She states she is just felt like she has not had any energy.  She states that she gets tired and does not feel like doing her normal activities.  She also reports he has not had much of an appetite.  She states that food just does not sound appetizing to her.  When she swallows it, she has no trouble swallowing and states this does not cause any abdominal pain she just does not feel like eating.  She reports that she is lost about 15 to 20 pounds since then.  She does report that she has been drinking a lot of regular sodas.  She had her sister check her blood sugar the other day and it read high.  She does report she has been taking her metformin.  She has not had any fevers, night sweats, chest pain, difficulty breathing, abdominal pain, vomiting, dysuria, hematuria.  The history is provided by the patient.       Past Medical History:  Diagnosis Date  . Diabetes mellitus without complication (HCC)   . Hypertension     Patient Active Problem List   Diagnosis Date Noted  . Special screening for malignant neoplasms, colon   . CVA (cerebral infarction) 08/20/2014  . Diplopia 08/20/2014  . Hyperglycemia 08/20/2014  . HTN (hypertension) 08/20/2014    Past Surgical History:  Procedure Laterality Date  . COLONOSCOPY N/A 06/11/2017   Procedure: COLONOSCOPY;  Surgeon: West Bali, MD;  Location: [facility];  Service: Endoscopy;  Laterality: N/A;  12:00  . None    . TUBAL LIGATION       OB History   No obstetric history on file.     Family History  Problem Relation Age of Onset  . Cancer  Mother   . Colon cancer Neg Hx   . Colon polyps Neg Hx     Social History   Tobacco Use  . Smoking status: Never Smoker  . Smokeless tobacco: Never Used  Substance Use Topics  . Alcohol use: No  . Drug use: No    Home Medications Prior to Admission medications   Medication Sig Start Date End Date Taking? Authorizing Provider  aspirin 325 MG tablet Take 1 tablet (325 mg total) by mouth daily. 08/22/14  Yes Philip Aspen, Limmie Patricia, MD  co-enzyme Q-10 50 MG capsule Take 50 mg by mouth daily.   Yes [provider]  losartan-hydrochlorothiazide (HYZAAR) 50-12.5 MG per tablet Take 1 tablet by mouth daily.   Yes [provider]  metFORMIN (GLUCOPHAGE) 500 MG tablet Take 1,000 mg by mouth daily with breakfast.   Yes [provider]  Multiple Vitamins-Minerals (CENTRUM ADULTS PO) Take 1 capsule by mouth daily.   Yes [provider]  atorvastatin (LIPITOR) 20 MG tablet Take 1 tablet (20 mg total) by mouth daily at 6 PM. 08/22/14   Philip Aspen, Limmie Patricia, MD    Allergies    Patient has no known allergies.  Review of Systems   Review of Systems  Constitutional: Positive for appetite change  and fatigue. Negative for fever.  Respiratory: Negative for cough and shortness of breath.   Cardiovascular: Negative for chest pain.  Gastrointestinal: Negative for abdominal pain, nausea and vomiting.  Genitourinary: Negative for dysuria and hematuria.  Neurological: Negative for headaches.  All other systems reviewed and are negative.   Physical Exam Updated Vital Signs BP 95/64 (BP Location: Left Arm)   Pulse 95   Temp 98.2 F (36.8 C) (Oral)   Resp 16   Ht 5\' 5"  (1.651 m)   Wt 72.1 kg   SpO2 100%   BMI 26.46 kg/m   Physical Exam Vitals and nursing note reviewed.  Constitutional:      Appearance: Normal appearance. She is well-developed and well-nourished.  HENT:     Head: Normocephalic and atraumatic.     Mouth/Throat:     Mouth:  Oropharynx is clear and moist and mucous membranes are normal.  Eyes:     General: Lids are normal.     Extraocular Movements: EOM normal.     Conjunctiva/sclera: Conjunctivae normal.     Pupils: Pupils are equal, round, and reactive to light.  Cardiovascular:     Rate and Rhythm: Normal rate and regular rhythm.     Pulses: Normal pulses.     Heart sounds: Normal heart sounds. No murmur heard. No friction rub. No gallop.   Pulmonary:     Effort: Pulmonary effort is normal.     Breath sounds: Normal breath sounds.     Comments: Lungs clear to auscultation bilaterally.  Symmetric chest rise.  No wheezing, rales, rhonchi. Abdominal:     Palpations: Abdomen is soft. Abdomen is not rigid.     Tenderness: There is no abdominal tenderness. There is no guarding.     Comments: Abdomen is soft, non-distended, non-tender. No rigidity, No guarding. No peritoneal signs.  Musculoskeletal:        General: Normal range of motion.     Cervical back: Full passive range of motion without pain.  Skin:    General: Skin is warm and dry.     Capillary Refill: Capillary refill takes less than 2 seconds.  Neurological:     Mental Status: She is alert and oriented to person, place, and time.  Psychiatric:        Mood and Affect: Mood and affect normal.        Speech: Speech normal.     ED Results / Procedures / Treatments   Labs (all labs ordered are listed, but only abnormal results are displayed) Labs Reviewed  BASIC METABOLIC PANEL - Abnormal; Notable for the following components:      Result Value   Glucose, Bld 126 (*)    BUN 65 (*)    Creatinine, Ser 3.65 (*)    GFR, Estimated 13 (*)    All other components within normal limits  CBC - Abnormal; Notable for the following components:   WBC 19.1 (*)    RBC 3.56 (*)    Hemoglobin 10.3 (*)    HCT 31.0 (*)    RDW 15.6 (*)    All other components within normal limits  URINALYSIS, ROUTINE W REFLEX MICROSCOPIC - Abnormal; Notable for the  following components:   Glucose, UA >=500 (*)    Ketones, ur 5 (*)    Leukocytes,Ua SMALL (*)    Bacteria, UA FEW (*)    All other components within normal limits  BASIC METABOLIC PANEL - Abnormal; Notable for the following components:   Sodium 132 (*)  Chloride 93 (*)    Glucose, Bld 505 (*)    Creatinine, Ser 1.72 (*)    GFR, Estimated 31 (*)    All other components within normal limits  CBG MONITORING, ED - Abnormal; Notable for the following components:   Glucose-Capillary 599 (*)    All other components within normal limits  CBG MONITORING, ED - Abnormal; Notable for the following components:   Glucose-Capillary >600 (*)    All other components within normal limits  I-STAT CHEM 8, ED - Abnormal; Notable for the following components:   Sodium 130 (*)    Chloride 90 (*)    BUN 24 (*)    Creatinine, Ser 1.80 (*)    Glucose, Bld 596 (*)    Hemoglobin 15.6 (*)    All other components within normal limits  CBG MONITORING, ED - Abnormal; Notable for the following components:   Glucose-Capillary 465 (*)    All other components within normal limits  CBG MONITORING, ED - Abnormal; Notable for the following components:   Glucose-Capillary 443 (*)    All other components within normal limits  CBG MONITORING, ED - Abnormal; Notable for the following components:   Glucose-Capillary 394 (*)    All other components within normal limits  SARS CORONAVIRUS 2 (TAT 6-24 HRS)  CBC WITH DIFFERENTIAL/PLATELET  POC OCCULT BLOOD, ED    EKG None  Radiology CT ABDOMEN PELVIS WO CONTRAST  Result Date: 02/04/2020 CLINICAL DATA:  Abdominal pain. EXAM: CT ABDOMEN AND PELVIS WITHOUT CONTRAST TECHNIQUE: Multidetector CT imaging of the abdomen and pelvis was performed following the standard protocol without IV contrast. COMPARISON:  None. FINDINGS: Lower chest: Coronary artery calcified atherosclerosis in the left main artery, incompletely evaluated. The lower chest is otherwise normal.  Hepatobiliary: There is low attenuation in the right hepatic lobe, at least partially involving the gallbladder fossa. It does extend more anteriorly and posteriorly. The liver is otherwise unremarkable. The gallbladder is normal in appearance. Pancreas: Unremarkable. No pancreatic ductal dilatation or surrounding inflammatory changes. Spleen: Normal in size without focal abnormality. Adrenals/Urinary Tract: The adrenal glands are normal. No renal stones identified. No hydronephrosis, perinephric stranding, ureterectasis, or ureteral stone, or bladder abnormality. Stomach/Bowel: The stomach and small bowel are normal. Colonic diverticulosis is identified without diverticulitis. The remainder of the colon is normal. The appendix is normal in appearance. Vascular/Lymphatic: Calcified atherosclerosis in the nonaneurysmal aorta. No adenopathy. Reproductive: Calcified fibroids are seen in the uterus. The adnexa are unremarkable. Other: Fat containing periumbilical hernia is identified. No free air free fluid. Musculoskeletal: No acute or significant osseous findings. IMPRESSION: 1. No acute cause for abdominal pain identified. 2. There is low attenuation in the right hepatic lobe, at least partially involving the gallbladder fossa. It does extend more anteriorly and posteriorly. The findings are nonspecific but could be due to focal fatty infiltration. Recommend an MRI for further evaluation. 3. Coronary artery calcifications, incompletely evaluated. 4. Calcified atherosclerosis in the nonaneurysmal aorta. 5. Colonic diverticulosis without diverticulitis. 6. Fat containing periumbilical hernia. 7. Calcified fibroids in the uterus. Aortic Atherosclerosis (ICD10-I70.0). Electronically Signed   By: Gerome Sam III M.D   On: 02/04/2020 17:48    Procedures Procedures (including critical care time)  Medications Ordered in ED Medications  sodium chloride 0.9 % bolus 1,000 mL (0 mLs Intravenous Stopped 02/04/20 1936)   insulin aspart (novoLOG) injection 4 Units (4 Units Intravenous Given 02/04/20 1959)    ED Course  I have reviewed the triage vital signs and the nursing  notes.  Pertinent labs & imaging results that were available during my care of the patient were reviewed by me and considered in my medical decision making (see chart for details).    MDM Rules/Calculators/A&P                          75 year old female who presents for evaluation of generalized weakness, fatigue that has been ongoing for about 4 to 6 weeks.  Patient feels like she just does not have any energy.  She states she has had some decreased appetite as well.  No abdominal pain, vomiting.  She also reports she is concerned about her blood sugars.  She reports that she has been drinking regular sodas.  Her sister checked her blood sugar the other day and was noted to be high.  Patient does report she has been taking her metformin as directed.  On initial arrival, she is afebrile, nontoxic-appearing.  She is very anxious.  She is tachycardic which likely reflects her anxiety.  On exam, no obvious abdominal tenderness.  Concern for DKA versus infectious etiology.  Patient has been vaccinated for COVID x3.  Will obtain COVID test.  Labs ordered in triage.  Initial CBG shows her blood sugar is 599.  Her initial blood work shows her BMP with a glucose of 126, bicarb of 25.  Anion gap is 10.  Her BUN is 65, creatinine is 3.65.  Review of her records that this is new for her.  She had blood work done in July 2021 which showed a creatinine of 0.9 at that time. She had it drawn again on 12/11/19 which showed a Cr of 1.10. CBC shows leukocytosis of 19.1.  Hemoglobin is 10.3.  Platelets are within normal limits.  UA shows small leukocytes.  She does have some pyuria.  Ketones are minimal.  Given concerns for possible new AKI, will plan for imaging for evaluation obstructive pathology.  Repeat POC CBG was greater than 600.  We will repeat a Chem-8 to  ensure that she is not in DKA.  I-STAT Chem-8 showed a BUN of 24, creatinine 1.80.  Her blood glucose was 596.  Her initial BMP showed that her blood glucose was 126.  Given the variations, concerned that her initial BMP is not correct.  We will plan to repeat both her CBC and BMP.    Repeat CBC shows no leukocytosis.  Hemoglobin stable at 13.9.  Repeat BMP shows glucose of 505, BUN of 23, creatinine of 1.72.  Bicarb is 25, anion gap is 14.  Work-up not consistent with DKA.  CT on pelvis shows no acute abnormality.  There is a low-attenuation right hepatic lobe that are nonspecific.  She also has evidence of cysts fat-containing periumbilical hernia.  She also has calcified fibroids in the uterus.  CBG is 394 after insulin, fluids.  Patient's work-up not consistent with DKA at this time.  Patient is otherwise very reassuring.  I discussed results with patient.  Patient would like to go home.  Instructed her to follow-up with her primary care doctor as directed. At this time, patient exhibits no emergent life-threatening condition that require further evaluation in ED. Patient had ample opportunity for questions and discussion. All patient's questions were answered with full understanding. Strict return precautions discussed. Patient expresses understanding and agreement to plan.   Portions of this note were generated with Scientist, clinical (histocompatibility and immunogenetics)Dragon dictation software. Dictation errors may occur despite best attempts at proofreading.  Final Clinical Impression(s) / ED Diagnoses Final diagnoses:  Hyperglycemia    Rx / DC Orders ED Discharge Orders    None       Rosana Hoes 02/04/20 2258    Vanetta Mulders, MD 02/15/20 [phone]

## 2020-02-04 NOTE — ED Triage Notes (Signed)
Pt states CBG at home read high yesterday. Pt with hx of diabetes.  Pt states she is not eating and losing weight(15-20 lbs).   Pt denies any fevers or N/V/D. Pt taking oral medication for diabetes.

## 2020-02-04 NOTE — ED Notes (Signed)
Patient to CT at this time

## 2020-02-04 NOTE — ED Provider Notes (Signed)
Work-up here confused by some abnormal labs.  Medical screening examination/treatment/procedure(s) were conducted as a shared visit with non-physician practitioner(s) and myself.  I personally evaluated the patient during the encounter.  Distally we thought she was in need admission due to elevated BUN and creatinine and then we thought may be due to elevated blood sugars.  But we repeated labs.  And CBC is okay renal functions normal.  Blood sugar is now down to 300s no evidence of DKA.  Patient prefers for discharge home.  She did improve with some insulin here.  Stable for discharge home.  Patient nontoxic no acute distress.      Vanetta Mulders, MD 02/04/20 2207

## 2020-02-04 NOTE — ED Notes (Signed)
ED Provider at bedside. 

## 2020-02-05 LAB — SARS CORONAVIRUS 2 (TAT 6-24 HRS): SARS Coronavirus 2: NEGATIVE

## 2020-02-14 ENCOUNTER — Ambulatory Visit: Payer: Self-pay | Admitting: "Endocrinology

## 2020-02-14 ENCOUNTER — Other Ambulatory Visit: Payer: Self-pay | Admitting: "Endocrinology

## 2020-02-14 ENCOUNTER — Encounter: Payer: Self-pay | Admitting: "Endocrinology

## 2020-02-14 ENCOUNTER — Ambulatory Visit: Payer: Medicare HMO | Admitting: "Endocrinology

## 2020-02-14 ENCOUNTER — Other Ambulatory Visit: Payer: Self-pay

## 2020-02-14 VITALS — BP 118/72 | HR 92 | Ht 65.0 in | Wt 164.2 lb

## 2020-02-14 DIAGNOSIS — E1122 Type 2 diabetes mellitus with diabetic chronic kidney disease: Secondary | ICD-10-CM | POA: Insufficient documentation

## 2020-02-14 DIAGNOSIS — E1165 Type 2 diabetes mellitus with hyperglycemia: Secondary | ICD-10-CM | POA: Diagnosis not present

## 2020-02-14 DIAGNOSIS — E782 Mixed hyperlipidemia: Secondary | ICD-10-CM | POA: Insufficient documentation

## 2020-02-14 DIAGNOSIS — N1832 Chronic kidney disease, stage 3b: Secondary | ICD-10-CM | POA: Insufficient documentation

## 2020-02-14 DIAGNOSIS — I10 Essential (primary) hypertension: Secondary | ICD-10-CM | POA: Diagnosis not present

## 2020-02-14 DIAGNOSIS — N183 Chronic kidney disease, stage 3 unspecified: Secondary | ICD-10-CM

## 2020-02-14 DIAGNOSIS — IMO0002 Reserved for concepts with insufficient information to code with codable children: Secondary | ICD-10-CM

## 2020-02-14 MED ORDER — ACCU-CHEK GUIDE ME W/DEVICE KIT: 1.0000 | PACK | 0 refills | Status: DC

## 2020-02-14 MED ORDER — ACCU-CHEK GUIDE VI STRP: ORAL_STRIP | 2 refills | Status: DC

## 2020-02-14 NOTE — Progress Notes (Signed)
                                                              Endocrinology Consult Note       02/14/2020, 1:58 PM   Subjective:    Patient ID: Martha Ray, female    DOB: 12/09/1945.  Tiann A Pavey is being seen in consultation for management of currently uncontrolled symptomatic diabetes requested by  Kikel, Stephen, MD.   Past Medical History:  Diagnosis Date  . Diabetes mellitus without complication (HCC)   . Hypertension     Past Surgical History:  Procedure Laterality Date  . COLONOSCOPY N/A 06/11/2017   Procedure: COLONOSCOPY;  Surgeon: Fields, Sandi L, MD;  Location: [facility];  Service: Endoscopy;  Laterality: N/A;  12:00  . None    . TUBAL LIGATION      Social History   Socioeconomic History  . Marital status: Married    Spouse name: Not on file  . Number of children: Not on file  . Years of education: Not on file  . Highest education level: Not on file  Occupational History  . Not on file  Tobacco Use  . Smoking status: Never Smoker  . Smokeless tobacco: Never Used  Vaping Use  . Vaping Use: Never used  Substance and Sexual Activity  . Alcohol use: No  . Drug use: No  . Sexual activity: Not on file  Other Topics Concern  . Not on file  Social History Narrative  . Not on file   Social Determinants of Health   Financial Resource Strain: Not on file  Food Insecurity: Not on file  Transportation Needs: Not on file  Physical Activity: Not on file  Stress: Not on file  Social Connections: Not on file    Family History  Problem Relation Age of Onset  . Cancer Mother   . Colon cancer Neg Hx   . Colon polyps Neg Hx     Outpatient Encounter Medications as of 02/14/2020  Medication Sig  . [DISCONTINUED] Blood Glucose Monitoring Suppl (ACCU-CHEK GUIDE ME) w/Device KIT 1 Piece by Does not apply route as directed.  . [DISCONTINUED] glucose blood (ACCU-CHEK GUIDE) test strip Use as instructed  . aspirin 325 MG tablet Take 1  tablet (325 mg total) by mouth daily.  . atorvastatin (LIPITOR) 20 MG tablet Take 1 tablet (20 mg total) by mouth daily at 6 PM.  . co-enzyme Q-10 50 MG capsule Take 50 mg by mouth daily.  . JANUVIA 50 MG tablet Take 50 mg by mouth daily.  . losartan-hydrochlorothiazide (HYZAAR) 50-12.5 MG per tablet Take 1 tablet by mouth daily.  . metFORMIN (GLUCOPHAGE) 500 MG tablet Take 500 mg by mouth daily with breakfast.  . Multiple Vitamins-Minerals (CENTRUM ADULTS PO) Take 1 capsule by mouth daily.   No facility-administered encounter medications on file as of 02/14/2020.    ALLERGIES: No Known Allergies  VACCINATION STATUS:  There is no immunization history on file for this patient.  Diabetes She presents for her initial diabetic visit. She has type 2 diabetes mellitus. Her disease course has been worsening. There are no hypoglycemic associated symptoms. Pertinent negatives for hypoglycemia include no confusion, headaches, pallor or seizures. Associated symptoms include blurred vision, fatigue, foot ulcerations, polydipsia and   polyuria. Pertinent negatives for diabetes include no chest pain and no polyphagia. There are no hypoglycemic complications. Symptoms are worsening. Diabetic complications include nephropathy. Risk factors for coronary artery disease include diabetes mellitus, hypertension, post-menopausal and sedentary lifestyle. Current diabetic treatments: She is currently on metformin 1000 mg p.o. daily, Januvia 50 mg p.o. daily. Her weight is fluctuating minimally. She is following a generally unhealthy diet. When asked about meal planning, she reported none. She has not had a previous visit with a dietitian. She never participates in exercise. (She did not bring any logs nor meter with her.  She does not monitor blood glucose regularly.  Her recent A1c was 13.5% on December 11, 2019.) An ACE inhibitor/angiotensin II receptor blocker is being taken. Eye exam is current.  Hypertension This is  a chronic problem. The current episode started more than 1 year ago. Associated symptoms include blurred vision. Pertinent negatives include no chest pain, headaches, palpitations or shortness of breath. Risk factors for coronary artery disease include diabetes mellitus, post-menopausal state and sedentary lifestyle. Past treatments include angiotensin blockers. Hypertensive end-organ damage includes kidney disease. Identifiable causes of hypertension include chronic renal disease.  Hyperlipidemia This is a chronic problem. The current episode started more than 1 year ago. The problem is uncontrolled. Exacerbating diseases include chronic renal disease. Pertinent negatives include no chest pain, myalgias or shortness of breath. Current antihyperlipidemic treatment includes statins. Risk factors for coronary artery disease include diabetes mellitus, dyslipidemia, hypertension, a sedentary lifestyle and post-menopausal.     Review of Systems  Constitutional: Positive for fatigue. Negative for chills, fever and unexpected weight change.  HENT: Negative for trouble swallowing and voice change.   Eyes: Positive for blurred vision. Negative for visual disturbance.  Respiratory: Negative for cough, shortness of breath and wheezing.   Cardiovascular: Negative for chest pain, palpitations and leg swelling.  Gastrointestinal: Negative for diarrhea, nausea and vomiting.  Endocrine: Positive for polydipsia and polyuria. Negative for cold intolerance, heat intolerance and polyphagia.  Musculoskeletal: Negative for arthralgias and myalgias.  Skin: Negative for color change, pallor, rash and wound.  Neurological: Negative for seizures and headaches.  Psychiatric/Behavioral: Negative for confusion and suicidal ideas.    Objective:    Vitals with BMI 02/14/2020 02/04/2020 02/04/2020  Height 5' 5" - -  Weight 164 lbs 3 oz - -  BMI 27.32 - -  Systolic 118 95 91  Diastolic 72 64 65  Pulse 92 95 96    BP 118/72    Pulse 92   Ht 5' 5" (1.651 m)   Wt 164 lb 3.2 oz (74.5 kg)   BMI 27.32 kg/m   Wt Readings from Last 3 Encounters:  02/14/20 164 lb 3.2 oz (74.5 kg)  02/04/20 159 lb (72.1 kg)  06/11/17 180 lb (81.6 kg)     Physical Exam Constitutional:      Appearance: She is well-developed.  HENT:     Head: Normocephalic and atraumatic.  Eyes:     Extraocular Movements: EOM normal.  Neck:     Thyroid: No thyromegaly.     Trachea: No tracheal deviation.  Cardiovascular:     Rate and Rhythm: Normal rate and regular rhythm.  Pulmonary:     Effort: Pulmonary effort is normal.  Abdominal:     Tenderness: There is no abdominal tenderness. There is no guarding.  Musculoskeletal:        General: No edema. Normal range of motion.     Cervical back: Normal range of motion and neck   supple.  Skin:    General: Skin is warm and dry.     Coloration: Skin is not pale.     Findings: No erythema or rash.  Neurological:     Mental Status: She is alert and oriented to person, place, and time.     Cranial Nerves: No cranial nerve deficit.     Coordination: Coordination normal.     Deep Tendon Reflexes: Reflexes are normal and symmetric.  Psychiatric:        Mood and Affect: Mood and affect normal.        Judgment: Judgment normal.       CMP ( most recent) CMP     Component Value Date/Time   NA 132 (L) 02/04/2020 1944   K 4.4 02/04/2020 1944   CL 93 (L) 02/04/2020 1944   CO2 25 02/04/2020 1944   GLUCOSE 505 (HH) 02/04/2020 1944   BUN 23 02/04/2020 1944   BUN 12 12/11/2019 0000   CREATININE 1.72 (H) 02/04/2020 1944   CALCIUM 9.2 02/04/2020 1944   PROT 6.6 08/20/2014 1127   ALBUMIN 3.9 08/20/2014 1127   AST 20 08/20/2014 1127   ALT 23 08/20/2014 1127   ALKPHOS 42 08/20/2014 1127   BILITOT 0.6 08/20/2014 1127   GFRNONAA 31 (L) 02/04/2020 1944   GFRAA 58 12/11/2019 0000     Diabetic Labs (most recent): Lab Results  Component Value Date   HGBA1C 13.5 12/11/2019   HGBA1C 6.3 (H)  08/20/2014     Lipid Panel ( most recent) Lipid Panel     Component Value Date/Time   CHOL 208 (H) 08/21/2014 0615   TRIG 157 (H) 08/21/2014 0615   HDL 32 (L) 08/21/2014 0615   CHOLHDL 6.5 08/21/2014 0615   VLDL 31 08/21/2014 0615   LDLCALC 145 (H) 08/21/2014 0615     Assessment & Plan:   1. Uncontrolled type 2 diabetes mellitus with stage 3 chronic kidney disease (HCC)  - Marianne A Colunga has currently uncontrolled symptomatic type 2 DM since  75 years of age,  with most recent A1c of 13.5 %. Recent labs reviewed. She did not bring any logs nor meter with her.  She does not monitor blood glucose regularly.  - I had a long discussion with her about the progressive nature of diabetes and the pathology behind its complications. 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 These are all discussed in detail with her.  - I have counseled her on diet  and weight management  by adopting a carbohydrate restricted/protein rich diet. Patient is encouraged to switch to  unprocessed or minimally processed     complex starch and increased protein intake (animal or plant source), fruits, and vegetables. -  she is advised to stick to a routine mealtimes to eat 3 meals  a day and avoid unnecessary snacks ( to snack only to correct hypoglycemia).   - she acknowledges that there is a room for improvement in her food and drink choices. - Suggestion is made for her to avoid simple carbohydrates  from her diet including Cakes, Sweet Desserts, Ice Cream, Soda (diet and regular), Sweet Tea, Candies, Chips, Cookies, Store Bought Juices, Alcohol in Excess of  1-2 drinks a day, Artificial Sweeteners,  Coffee Creamer, and "Sugar-free" Products. This will help patient to have more stable blood glucose profile and potentially avoid unintended weight gain.  -   she will be scheduled with  Penny Crumpton, RDN, CDE for diabetes education.  - I have approached her with the following individualized plan to manage  her diabetes and patient agrees:   -In light of her presentation with chronic glycemic burden, she will need insulin treatment in order for her to achieve control of diabetes to target. -In preparation, she is approached to start trict monitoring of glucose 4 times a day-before meals and at bedtime, and return in 1 week with her meter and logs.  I ordered glucose meter and supplies for her.  - she is encouraged to call clinic for blood glucose levels less than 70 or above 300 mg /dl. - she is advised to lower her metformin to 500 mg p.o. daily in light of her CKD.  She will continue to benefit from Januvia 50 mg p.o. daily at breakfast.    - Specific targets for  A1c;  LDL, HDL,  and Triglycerides were discussed with the patient.  2) Blood Pressure /Hypertension:  her blood pressure is  controlled to target.   she is advised to continue her current medications including losartan/HCTZ 50/12.5 mg p.o. daily with breakfast . 3) Lipids/Hyperlipidemia: She does not have recent lipid panel to review.  Her last LDL was 145.  She is advised to continue Lipitor 20 mg p.o. nightly.    Side effects and precautions discussed with her.  4)  Weight/Diet:  Body mass index is 27.32 kg/m.  - she is not a candidate for major weight loss. I discussed with her the fact that loss of 5 - 10% of her  current body weight will have the most impact on her diabetes management.  Exercise, and detailed carbohydrates information provided  -  detailed on discharge instructions.  5) Chronic Care/Health Maintenance:  -she  is on ACEI/ARB and Statin medications and  is encouraged to initiate and continue to follow up with Ophthalmology, Dentist,  Podiatrist at least yearly or according to recommendations, and advised to   stay away from smoking. I have recommended yearly flu vaccine and pneumonia vaccine at  least every 5 years; moderate intensity exercise for up to 150 minutes weekly; and  sleep for at least 7 hours a day.  - she is  advised to maintain close follow up with Kikel, Stephen, MD for primary care needs, as well as her other providers for optimal and coordinated care.   - Time spent in this patient care: 60 min, of which > 50% was spent in  counseling  her about her currently uncontrolled type 2 diabetes, hypertension, hyperlipidemia and the rest reviewing her blood glucose logs , discussing her hypoglycemia and hyperglycemia episodes, reviewing her current and  previous labs / studies  ( including abstraction from other facilities) and medications  doses and developing a  long term treatment plan based on the latest standards of care/ guidelines; and documenting her care.    Please refer to Patient Instructions for Blood Glucose Monitoring and Insulin/Medications Dosing Guide"  in media tab for additional information. Please  also refer to " Patient Self Inventory" in the Media  tab for reviewed elements of pertinent patient history.  Mariya A Molden participated in the discussions, expressed understanding, and voiced agreement with the above plans.  All questions were answered to her satisfaction. she is encouraged to contact clinic should she have any questions or concerns prior to her return visit.   Follow up plan: - Return in about 1 week (around 02/21/2020) for   F/U with Meter and Logs Only - no Labs.  Glade Lloyd, MD [facility] [address] [place], [place] [address] Phone: [phone]  Fax: [phone]    02/14/2020, 1:58 PM  This note was partially dictated with voice recognition software. Similar sounding words can be transcribed inadequately or may not  be corrected upon review.

## 2020-02-14 NOTE — Patient Instructions (Signed)
                                     Advice for Weight Management  -For most of us the best way to lose weight is by diet management. Generally speaking, diet management means consuming less calories intentionally which over time brings about progressive weight loss.  This can be achieved more effectively by restricting carbohydrate consumption to the minimum possible.  So, it is critically important to know your numbers: how much calorie you are consuming and how much calorie you need. More importantly, our carbohydrates sources should be unprocessed or minimally processed complex starch food items.   Sometimes, it is important to balance nutrition by increasing protein intake (animal or plant source), fruits, and vegetables.  -Sticking to a routine mealtime to eat 3 meals a day and avoiding unnecessary snacks is shown to have a big role in weight control. Under normal circumstances, the only time we lose real weight is when we are hungry, so allow hunger to take place- hunger means no food between meal times, only water.  It is not advisable to starve.   -It is better to avoid simple carbohydrates including: Cakes, Sweet Desserts, Ice Cream, Soda (diet and regular), Sweet Tea, Candies, Chips, Cookies, Store Bought Juices, Alcohol in Excess of  1-2 drinks a day, Lemonade,  Artificial Sweeteners, Doughnuts, Coffee Creamers, "Sugar-free" Products, etc, etc.  This is not a complete list.....    -Consulting with certified diabetes educators is proven to provide you with the most accurate and current information on diet.  Also, you may be  interested in discussing diet options/exchanges , we can schedule a visit with Martha Ray, RDN, CDE for individualized nutrition education.  -Exercise: If you are able: 30 -60 minutes a day ,4 days a week, or 150 minutes a week.  The longer the better.  Combine stretch, strength, and aerobic activities.  If you were told in the  past that you have high risk for cardiovascular diseases, you may seek evaluation by your heart doctor prior to initiating moderate to intense exercise programs.                                  Additional Care Considerations for Diabetes   -Diabetes  is a chronic disease.  The most important care consideration is regular follow-up with your diabetes care provider with the goal being avoiding or delaying its complications and to take advantage of advances in medications and technology.    -Type 2 diabetes is known to coexist with other important comorbidities such as high blood pressure and high cholesterol.  It is critical to control not only the diabetes but also the high blood pressure and high cholesterol to minimize and delay the risk of complications including coronary artery disease, stroke, amputations, blindness, etc.    - Studies showed that people with diabetes will benefit from a class of medications known as ACE inhibitors and statins.  Unless there are specific reasons not to be on these medications, the standard of care is to consider getting one from these groups of medications at an optimal doses.  These medications are generally considered safe and proven to help protect the heart and the kidneys.    - People with diabetes are encouraged to initiate and maintain regular follow-up with eye doctors, foot   doctors, dentists , and if necessary heart and kidney doctors.     - It is highly recommended that people with diabetes quit smoking or stay away from smoking, and get yearly  flu vaccine and pneumonia vaccine at least every 5 years.  One other important lifestyle recommendation is to ensure adequate sleep - at least 6-7 hours of uninterrupted sleep at night.  -Exercise: If you are able: 30 -60 minutes a day, 4 days a week, or 150 minutes a week.  The longer the better.  Combine stretch, strength, and aerobic activities.  If you were told in the past that you have high risk for  cardiovascular diseases, you may seek evaluation by your heart doctor prior to initiating moderate to intense exercise programs.          

## 2020-02-16 ENCOUNTER — Other Ambulatory Visit: Payer: Self-pay | Admitting: "Endocrinology

## 2020-02-19 MED ORDER — BLOOD GLUCOSE MONITOR KIT: 1.0000 | PACK | Freq: Four times a day (QID) | 0 refills | Status: DC

## 2020-02-21 ENCOUNTER — Encounter: Payer: Self-pay | Admitting: "Endocrinology

## 2020-02-21 ENCOUNTER — Ambulatory Visit (INDEPENDENT_AMBULATORY_CARE_PROVIDER_SITE_OTHER): Payer: Medicare HMO | Admitting: "Endocrinology

## 2020-02-21 ENCOUNTER — Other Ambulatory Visit: Payer: Self-pay

## 2020-02-21 VITALS — BP 152/81 | HR 94 | Ht 65.0 in | Wt 164.0 lb

## 2020-02-21 DIAGNOSIS — E1165 Type 2 diabetes mellitus with hyperglycemia: Secondary | ICD-10-CM

## 2020-02-21 DIAGNOSIS — E782 Mixed hyperlipidemia: Secondary | ICD-10-CM | POA: Diagnosis not present

## 2020-02-21 DIAGNOSIS — N183 Chronic kidney disease, stage 3 unspecified: Secondary | ICD-10-CM | POA: Diagnosis not present

## 2020-02-21 DIAGNOSIS — I10 Essential (primary) hypertension: Secondary | ICD-10-CM

## 2020-02-21 DIAGNOSIS — E1122 Type 2 diabetes mellitus with diabetic chronic kidney disease: Secondary | ICD-10-CM

## 2020-02-21 DIAGNOSIS — IMO0002 Reserved for concepts with insufficient information to code with codable children: Secondary | ICD-10-CM

## 2020-02-21 MED ORDER — LEVEMIR FLEXTOUCH 100 UNIT/ML ~~LOC~~ SOPN: 20.0000 [IU] | PEN_INJECTOR | Freq: Every day | SUBCUTANEOUS | 1 refills | Status: DC

## 2020-02-21 MED ORDER — BD PEN NEEDLE SHORT U/F 31G X 8 MM MISC: 1.0000 | 3 refills | Status: DC

## 2020-02-21 NOTE — Patient Instructions (Signed)

## 2020-02-21 NOTE — Progress Notes (Signed)
02/21/2020, 11:45 AM  Endocrinology follow-up note   Subjective:    Patient ID: Martha Ray, female    DOB: [birth year].  Martha Ray is being seen in follow-up after she was seen in consultation for management of currently uncontrolled symptomatic diabetes requested by  Vidal Schwalbe, MD.   Past Medical History:  Diagnosis Date  . Diabetes mellitus without complication ([place])   . Hypertension     Past Surgical History:  Procedure Laterality Date  . COLONOSCOPY N/A 06/11/2017   Procedure: COLONOSCOPY;  Surgeon: Danie Binder, MD;  Location: [facility];  Service: Endoscopy;  Laterality: N/A;  12:00  . None    . TUBAL LIGATION      Social History   Socioeconomic History  . Marital status: Married    Spouse name: Not on file  . Number of children: Not on file  . Years of education: Not on file  . Highest education level: Not on file  Occupational History  . Not on file  Tobacco Use  . Smoking status: Never Smoker  . Smokeless tobacco: Never Used  Vaping Use  . Vaping Use: Never used  Substance and Sexual Activity  . Alcohol use: No  . Drug use: No  . Sexual activity: Not on file  Other Topics Concern  . Not on file  Social History Narrative  . Not on file   Social Determinants of Health   Financial Resource Strain: Not on file  Food Insecurity: Not on file  Transportation Needs: Not on file  Physical Activity: Not on file  Stress: Not on file  Social Connections: Not on file    Family History  Problem Relation Age of Onset  . Cancer Mother   . Colon cancer Neg Hx   . Colon polyps Neg Hx     Outpatient Encounter Medications as of 02/21/2020  Medication Sig  . insulin detemir (LEVEMIR FLEXTOUCH) 100 UNIT/ML FlexPen Inject 20 Units into the skin daily.  . Insulin Pen Needle (B-D ULTRAFINE III SHORT PEN) 31G X 8 MM MISC 1 each by Does not apply route as directed.  Marland Kitchen  aspirin 325 MG tablet Take 1 tablet (325 mg total) by mouth daily.  Marland Kitchen atorvastatin (LIPITOR) 20 MG tablet Take 1 tablet (20 mg total) by mouth daily at 6 PM.  . blood glucose meter kit and supplies KIT 1 each by Does not apply route 4 (four) times daily. Dispense based on patient and insurance preference. Use up to four times daily as directed. (FOR ICD-9 250.00, 250.01).  . Blood Glucose Monitoring Suppl (ACCU-CHEK GUIDE ME) w/Device KIT Test 4 x daily  . co-enzyme Q-10 50 MG capsule Take 50 mg by mouth daily.  Marland Kitchen glucose blood (ACCU-CHEK GUIDE) test strip Use as instructed 4 x daily. Dx E11.65  . JANUVIA 50 MG tablet Take 50 mg by mouth daily.  Marland Kitchen losartan-hydrochlorothiazide (HYZAAR) 50-12.5 MG per tablet Take 1 tablet by mouth daily.  . metFORMIN (GLUCOPHAGE) 500 MG tablet Take 500 mg by mouth daily with breakfast.  . Multiple Vitamins-Minerals (CENTRUM ADULTS PO) Take 1 capsule by mouth daily.   No facility-administered encounter medications  on file as of 02/21/2020.    ALLERGIES: No Known Allergies  VACCINATION STATUS:  There is no immunization history on file for this patient.  Diabetes She presents for her follow-up diabetic visit. She has type 2 diabetes mellitus. Her disease course has been worsening. There are no hypoglycemic associated symptoms. Pertinent negatives for hypoglycemia include no confusion, headaches, pallor or seizures. Associated symptoms include fatigue, foot ulcerations, polydipsia and polyuria. Pertinent negatives for diabetes include no blurred vision, no chest pain and no polyphagia. There are no hypoglycemic complications. Symptoms are improving. Diabetic complications include nephropathy. Risk factors for coronary artery disease include diabetes mellitus, hypertension, post-menopausal and sedentary lifestyle. Her weight is stable. She is following a generally unhealthy diet. When asked about meal planning, she reported none. She has not had a previous visit with a  dietitian. She never participates in exercise. Her home blood glucose trend is decreasing steadily. Her breakfast blood glucose range is generally >200 mg/dl. Her lunch blood glucose range is generally 180-200 mg/dl. Her dinner blood glucose range is generally 180-200 mg/dl. Her bedtime blood glucose range is generally >200 mg/dl. Her overall blood glucose range is >200 mg/dl. (She presents with her meter and logs showing slightly improving glycemic profile, still significant above target.  Her recent A1c was 13.5% on December 11, 2019.   ) An ACE inhibitor/angiotensin II receptor blocker is being taken. Eye exam is current.  Hypertension This is a chronic problem. The current episode started more than 1 year ago. Pertinent negatives include no blurred vision, chest pain, headaches, palpitations or shortness of breath. Risk factors for coronary artery disease include diabetes mellitus, post-menopausal state and sedentary lifestyle. Past treatments include angiotensin blockers. Hypertensive end-organ damage includes kidney disease. Identifiable causes of hypertension include chronic renal disease.  Hyperlipidemia This is a chronic problem. The current episode started more than 1 year ago. The problem is uncontrolled. Exacerbating diseases include chronic renal disease. Pertinent negatives include no chest pain, myalgias or shortness of breath. Current antihyperlipidemic treatment includes statins. Risk factors for coronary artery disease include diabetes mellitus, dyslipidemia, hypertension, a sedentary lifestyle and post-menopausal.     Review of Systems  Constitutional: Positive for fatigue. Negative for chills, fever and unexpected weight change.  HENT: Negative for trouble swallowing and voice change.   Eyes: Negative for blurred vision and visual disturbance.  Respiratory: Negative for cough, shortness of breath and wheezing.   Cardiovascular: Negative for chest pain, palpitations and leg swelling.   Gastrointestinal: Negative for diarrhea, nausea and vomiting.  Endocrine: Positive for polydipsia and polyuria. Negative for cold intolerance, heat intolerance and polyphagia.  Musculoskeletal: Negative for arthralgias and myalgias.  Skin: Negative for color change, pallor, rash and wound.  Neurological: Negative for seizures and headaches.  Psychiatric/Behavioral: Negative for confusion and suicidal ideas.    Objective:    Vitals with BMI 02/21/2020 02/14/2020 02/04/2020  Height 5' 5"  5' 5"  -  Weight 164 lbs 164 lbs 3 oz -  BMI 16.10 96.04 -  Systolic 540 981 95  Diastolic 81 72 64  Pulse 94 92 95    BP (!) 152/81   Pulse 94   Ht 5' 5"  (1.651 m)   Wt 164 lb (74.4 kg)   BMI 27.29 kg/m   Wt Readings from Last 3 Encounters:  02/21/20 164 lb (74.4 kg)  02/14/20 164 lb 3.2 oz (74.5 kg)  02/04/20 159 lb (72.1 kg)     Physical Exam Constitutional:      Appearance: She is  well-developed.  HENT:     Head: Normocephalic and atraumatic.  Neck:     Thyroid: No thyromegaly.     Trachea: No tracheal deviation.  Cardiovascular:     Rate and Rhythm: Normal rate and regular rhythm.  Pulmonary:     Effort: Pulmonary effort is normal.  Abdominal:     Tenderness: There is no abdominal tenderness. There is no guarding.  Musculoskeletal:        General: Normal range of motion.     Cervical back: Normal range of motion and neck supple.  Skin:    General: Skin is warm and dry.     Coloration: Skin is not pale.     Findings: No erythema or rash.  Neurological:     Mental Status: She is alert and oriented to person, place, and time.     Cranial Nerves: No cranial nerve deficit.     Coordination: Coordination normal.     Deep Tendon Reflexes: Reflexes are normal and symmetric.  Psychiatric:        Judgment: Judgment normal.       CMP ( most recent) CMP     Component Value Date/Time   NA 132 (L) 02/04/2020 1944   K 4.4 02/04/2020 1944   CL 93 (L) 02/04/2020 1944   CO2 25  02/04/2020 1944   GLUCOSE 505 (HH) 02/04/2020 1944   BUN 23 02/04/2020 1944   BUN 12 12/11/2019 0000   CREATININE 1.72 (H) 02/04/2020 1944   CALCIUM 9.2 02/04/2020 1944   PROT 6.6 08/20/2014 1127   ALBUMIN 3.9 08/20/2014 1127   AST 20 08/20/2014 1127   ALT 23 08/20/2014 1127   ALKPHOS 42 08/20/2014 1127   BILITOT 0.6 08/20/2014 1127   GFRNONAA 31 (L) 02/04/2020 1944   GFRAA 58 12/11/2019 0000     Diabetic Labs (most recent): Lab Results  Component Value Date   HGBA1C 13.5 12/11/2019   HGBA1C 6.3 (H) 08/20/2014     Lipid Panel ( most recent) Lipid Panel     Component Value Date/Time   CHOL 208 (H) 08/21/2014 0615   TRIG 157 (H) 08/21/2014 0615   HDL 32 (L) 08/21/2014 0615   CHOLHDL 6.5 08/21/2014 0615   VLDL 31 08/21/2014 0615   LDLCALC 145 (H) 08/21/2014 0615     Assessment & Plan:   1. Uncontrolled type 2 diabetes mellitus with stage 3 chronic kidney disease ([place])  - Martha Ray has currently uncontrolled symptomatic type 2 DM since  75 years of age. . Recent labs reviewed, showing significant renal insufficiency.  She presents with her meter and logs showing slightly improving glycemic profile, still significant above target.  Her recent A1c was 13.5% on December 11, 2019.   - I had a long discussion with her about the progressive nature of diabetes and the pathology behind its complications. 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 These are all discussed in detail with her.  - I have counseled her on diet  and weight management  by adopting a carbohydrate restricted/protein rich diet. Patient is encouraged to switch to  unprocessed or minimally processed     complex starch and increased protein intake (animal or plant source), fruits, and vegetables. -  she is advised to stick to a routine mealtimes to eat 3 meals  a day and  avoid unnecessary snacks ( to  snack only to correct hypoglycemia).   - she acknowledges that there is a room for improvement in her food and drink choices. - Suggestion is made for her to avoid simple carbohydrates  from her diet including Cakes, Sweet Desserts, Ice Cream, Soda (diet and regular), Sweet Tea, Candies, Chips, Cookies, Store Bought Juices, Alcohol in Excess of  1-2 drinks a day, Artificial Sweeteners,  Coffee Creamer, and "Sugar-free" Products, Lemonade. This will help patient to have more stable blood glucose profile and potentially avoid unintended weight gain.   - she will be scheduled with Jearld Fenton, RDN, CDE for diabetes education.  - I have approached her with the following individualized plan to manage  her diabetes and patient agrees:   -In light of her presentation with chronic glycemic burden, she will need at least basal insulin treatment in order for her to achieve control of diabetes to target.  She is approached with plan and she accepts. -I demonstrated insulin injection technique and gave her a sample.  She will be given a prescription for Levemir 20 units nightly, advised to continue trict monitoring of glucose 4 times a day-before meals and at bedtime.  - she is encouraged to call clinic for blood glucose levels less than 70 or above 200 mg /dl. - she is advised to continue Metformin 500 mg p.o. daily in light of her CKD.  She will continue to benefit from Januvia 50 mg p.o. daily at breakfast.    - Specific targets for  A1c;  LDL, HDL,  and Triglycerides were discussed with the patient.  2) Blood Pressure /Hypertension: Her blood pressure is not controlled to target. she is advised to continue her current medications including losartan/HCTZ 50/12.5 mg p.o. daily with breakfast .  3) Lipids/Hyperlipidemia: She does not have recent lipid panel to review.  Her last LDL was 145.  She is advised to continue Lipitor 20 mg p.o. nightly.    Side effects and  precautions discussed with her.  4)  Weight/Diet:  Body mass index is 27.29 kg/m.  - she is not a candidate for major weight loss. I discussed with her the fact that loss of 5 - 10% of her  current body weight will have the most impact on her diabetes management.  Exercise, and detailed carbohydrates information provided  -  detailed on discharge instructions.  5) Chronic Care/Health Maintenance:  -she  is on ACEI/ARB and Statin medications and  is encouraged to initiate and continue to follow up with Ophthalmology, Dentist,  Podiatrist at least yearly or according to recommendations, and advised to   stay away from smoking. I have recommended yearly flu vaccine and pneumonia vaccine at least every 5 years; moderate intensity exercise for up to 150 minutes weekly; and  sleep for at least 7 hours a day.  POCT ABI Results 02/21/20  Her ABIs normal today February 21, 2020. Right ABI: 1.06      left ABI: 1.08  Right leg systolic / diastolic: 132/44 mmHg Left leg systolic / diastolic: 010/27 mmHg  Arm systolic / diastolic: 253/66 mmHG This study will be repeated in January 2027, or sooner if needed.  - she is  advised to maintain close follow up with Vidal Schwalbe, MD for primary care needs, as well as her other providers for optimal and coordinated care.   - Time spent on this patient care encounter:  40 min, of which > 50% was spent in  counseling and the rest reviewing her blood glucose logs ,  discussing her hypoglycemia and hyperglycemia episodes, reviewing her current and  previous labs / studies  ( including abstraction from other facilities) and medications  doses and developing a  long term treatment plan and documenting her care.   Please refer to Patient Instructions for Blood Glucose Monitoring and Insulin/Medications Dosing Guide"  in media tab for additional information. Please  also refer to " Patient Self Inventory" in the Media  tab for reviewed elements of pertinent patient  history.  Martha Ray participated in the discussions, expressed understanding, and voiced agreement with the above plans.  All questions were answered to her satisfaction. she is encouraged to contact clinic should she have any questions or concerns prior to her return visit.    Follow up plan: - Return in about 5 weeks (around 03/27/2020) for Bring Meter and Logs- A1c in Office.  Glade Lloyd, MD [facility] [address] [place], [place] [address] Phone: [phone]  Fax: [phone]    02/21/2020, 11:45 AM  This note was partially dictated with voice recognition software. Similar sounding words can be transcribed inadequately or may not  be corrected upon review.

## 2020-03-06 ENCOUNTER — Other Ambulatory Visit: Payer: Self-pay

## 2020-03-06 ENCOUNTER — Encounter: Payer: Medicare HMO | Attending: "Endocrinology | Admitting: Nutrition

## 2020-03-06 DIAGNOSIS — IMO0002 Reserved for concepts with insufficient information to code with codable children: Secondary | ICD-10-CM

## 2020-03-06 DIAGNOSIS — N183 Chronic kidney disease, stage 3 unspecified: Secondary | ICD-10-CM | POA: Diagnosis present

## 2020-03-06 DIAGNOSIS — I10 Essential (primary) hypertension: Secondary | ICD-10-CM | POA: Diagnosis present

## 2020-03-06 DIAGNOSIS — E1165 Type 2 diabetes mellitus with hyperglycemia: Secondary | ICD-10-CM | POA: Diagnosis present

## 2020-03-06 DIAGNOSIS — E782 Mixed hyperlipidemia: Secondary | ICD-10-CM | POA: Insufficient documentation

## 2020-03-06 DIAGNOSIS — E1122 Type 2 diabetes mellitus with diabetic chronic kidney disease: Secondary | ICD-10-CM | POA: Diagnosis not present

## 2020-03-06 NOTE — Patient Instructions (Addendum)
Goals  Follow My Plate Drink 947 oz of water per day Increase fresh fruits and vegetables Cut out fast foods and processed foods. Avoid snacks between meals Don't eat past 7 pm Don't skip meals.

## 2020-03-06 NOTE — Progress Notes (Unsigned)
Medical Nutrition Therapy  This visit was completed via telephone due to the COVID-19 pandemic.   I spoke with Martha Ray and verified that I was speaking with the correct person with two patient identifiers (full name and date of birth).   I discussed the limitations related to this kind of visit and the patient is willing to proceed.  Appointment Start time:  1300  Appointment End time:  1400  Primary concerns today: Type 2 Diabetes  Referral diagnosis: E11.8 Preferred learning style: no preference indicate Learning readiness: change in progress   NUTRITION ASSESSMENT   Current diet is high in saturated fat, excessive in calories, high in sodium and low in fresh fruits and vegetables.  BS's are in the 200's most of the time. Craves salty foods and sweets at times. Drinks some water.   Anthropometrics  Wt Readings from Last 3 Encounters:  02/21/20 164 lb (74.4 kg)  02/14/20 164 lb 3.2 oz (74.5 kg)  02/04/20 159 lb (72.1 kg)   Ht Readings from Last 3 Encounters:  02/21/20 5\' 5"  (1.651 m)  02/14/20 5\' 5"  (1.651 m)  02/04/20 5\' 5"  (1.651 m)   BMI 27   Clinical Medical Hx: Obesity, HTN and Hyperlipidemia, CVA, CKD Medications: Januvia, Metformin 500 mg BID, Levemir 20 units Labs:  Lab Results  Component Value Date   HGBA1C 13.5 12/11/2019   CMP Latest Ref Rng & Units 02/04/2020 02/04/2020 02/04/2020  Glucose 70 - 99 mg/dL 04/03/2020) 04/03/2020) 04/03/2020)  BUN 8 - 23 mg/dL 23 973(ZH) 299(ME)  Creatinine 0.44 - 1.00 mg/dL 268(T) 41(D) 62(I)  Sodium 135 - 145 mmol/L 132(L) 130(L) 135  Potassium 3.5 - 5.1 mmol/L 4.4 4.3 4.5  Chloride 98 - 111 mmol/L 93(L) 90(L) 100  CO2 22 - 32 mmol/L 25 - 25  Calcium 8.9 - 10.3 mg/dL 9.2 - 8.9  Total Protein 6.5 - 8.1 g/dL - - -  Total Bilirubin 0.3 - 1.2 mg/dL - - -  Alkaline Phos 38 - 126 U/L - - -  AST 15 - 41 U/L - - -  ALT 14 - 54 U/L - - -   Lipid Panel     Component Value Date/Time   CHOL 208 (H) 08/21/2014 0615   TRIG 157 (H)  08/21/2014 0615   HDL 32 (L) 08/21/2014 0615   CHOLHDL 6.5 08/21/2014 0615   VLDL 31 08/21/2014 0615   LDLCALC 145 (H) 08/21/2014 0615    Notable Signs/Symptoms: Fatigue, thirsty, frequent urination, blurry vision,   Lifestyle & Dietary Hx Use to walk and stopped doing that. Stays in bed longer  Has been baking foods and cutting out fried foods. Has lost 20 lbs in the last few years.   Estimated daily fluid intake: oz Supplements: Sleep: Stays up and likes to sleep in  Stress / self-care:  Current average weekly physical activity: ADL   24-Hr Dietary Recall First Meal: Fat back meat, eggs fried 1, water Snack: Second Meal:chicken noodle soup, water Snack:  Third Meal: Fried chicken leg and breast, rice 1 tbsp, water Snack: Fried bologna sandwich withmustard, water Beverages: water, crystal light.    Estimated Energy Needs Calories: 1600 Carbohydrate: 180g Protein: 120g Fat: 44g   NUTRITION DIAGNOSIS  NB-1.1 Food and nutrition-related knowledge deficit As related to Diabetes Type 2.  As evidenced by A1C 13.2.   NUTRITION INTERVENTION  Nutrition education (E-1) on the following topics:  . Nutrition and Diabetes education provided on My Plate, CHO counting, meal planning, portion sizes, timing  of meals, avoiding snacks between meals unless having a low blood sugar, target ranges for A1C and blood sugars, signs/symptoms and treatment of hyper/hypoglycemia, monitoring blood sugars, taking medications as prescribed, benefits of exercising 30 minutes per day and prevention of complications of DM.   Handouts Provided Include -emailed.  .myplate  Diabetes instruction  Treatment of low and high blood sugars.  Learning Style & Readiness for Change Teaching method utilized: Visual & Auditory  Demonstrated degree of understanding via: Teach Back  Barriers to learning/adherence to lifestyle change: none  Goals Established by Pt Goals  Follow My Plate Drink 564 oz of  water per day Increase fresh fruits and vegetables Cut out fast foods and processed foods. Avoid snacks between meals Don't eat past 7 pm Don't skip meals.   MONITORING & EVALUATION Dietary intake, weekly physical activity, and blood sguars in 1 month.  Next Steps  Patient is to start evaluating blood sugars for trends.Marland Kitchen

## 2020-03-29 ENCOUNTER — Ambulatory Visit: Payer: Medicare HMO | Admitting: "Endocrinology

## 2020-04-03 ENCOUNTER — Ambulatory Visit: Payer: Medicare HMO | Admitting: Nutrition

## 2020-04-04 ENCOUNTER — Encounter: Payer: Self-pay | Admitting: Nutrition

## 2020-05-16 ENCOUNTER — Other Ambulatory Visit: Payer: Self-pay | Admitting: "Endocrinology

## 2020-05-29 ENCOUNTER — Telehealth: Payer: Self-pay | Admitting: "Endocrinology

## 2020-05-29 MED ORDER — LEVEMIR FLEXTOUCH 100 UNIT/ML ~~LOC~~ SOPN: 20.0000 [IU] | PEN_INJECTOR | Freq: Every day | SUBCUTANEOUS | 0 refills | Status: DC

## 2020-05-29 NOTE — Telephone Encounter (Signed)
Rx sent 

## 2020-05-29 NOTE — Telephone Encounter (Signed)
Pt is calling and states that she needs her rx LEVEMIR FLEXTOUCH 100 UNIT/ML FlexPen Called in for 30 days instead of 90 days. She states the 30 day is cheaper verse the 90 day. Please advise.  [id number] [place], [place] - [address] Phone:  [phone]  Fax:  [phone]

## 2020-06-13 ENCOUNTER — Other Ambulatory Visit (HOSPITAL_COMMUNITY): Payer: Self-pay | Admitting: Emergency Medicine

## 2020-06-13 DIAGNOSIS — Z1231 Encounter for screening mammogram for malignant neoplasm of breast: Secondary | ICD-10-CM

## 2020-06-17 ENCOUNTER — Ambulatory Visit (HOSPITAL_COMMUNITY)
Admission: RE | Admit: 2020-06-17 | Discharge: 2020-06-17 | Disposition: A | Discharge status: Home / Self Care | Payer: Medicare HMO | Source: Ambulatory Visit | Attending: Emergency Medicine | Admitting: Emergency Medicine

## 2020-06-17 DIAGNOSIS — Z1231 Encounter for screening mammogram for malignant neoplasm of breast: Secondary | ICD-10-CM | POA: Insufficient documentation

## 2020-09-20 ENCOUNTER — Other Ambulatory Visit: Payer: Self-pay | Admitting: "Endocrinology

## 2020-10-09 ENCOUNTER — Other Ambulatory Visit: Payer: Self-pay | Admitting: "Endocrinology

## 2021-01-28 IMAGING — CT CT ABD-PELV W/O CM
2 of 4 series · 16 of 46 positions shown, 18 images · non-contrast
Comparison: None.

CLINICAL DATA: Abdominal pain.

EXAM:
CT ABDOMEN AND PELVIS WITHOUT CONTRAST
TECHNIQUE: Multidetector CT imaging of the abdomen and pelvis was performed
following the standard protocol without IV contrast.

[Series 2: axial st · axial · 0.76mm/px · z∈[+292,+722]mm · 13 of 98 slices shown, 15 images]
[im 6/98  soft-tissue]
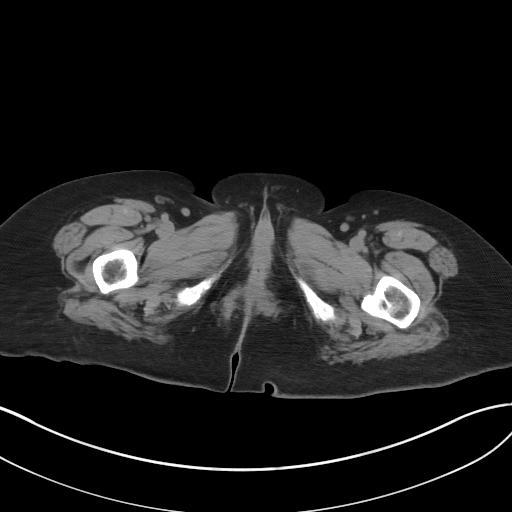
[im 6/98  bone]
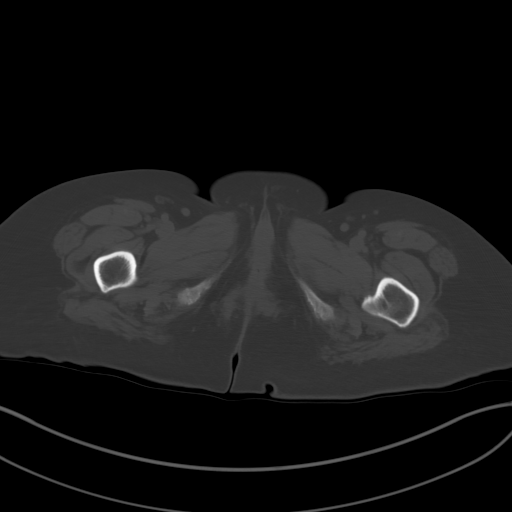
[im 12/98  soft-tissue]
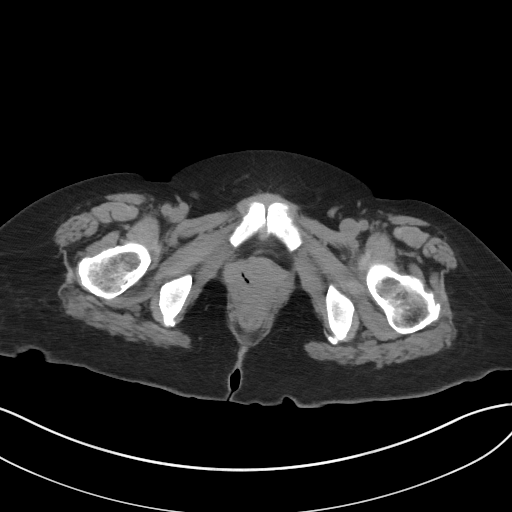
[im 23/98  soft-tissue]
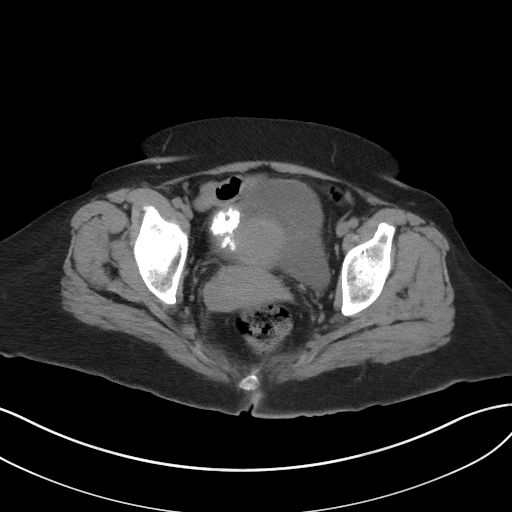
[im 29/98  soft-tissue]
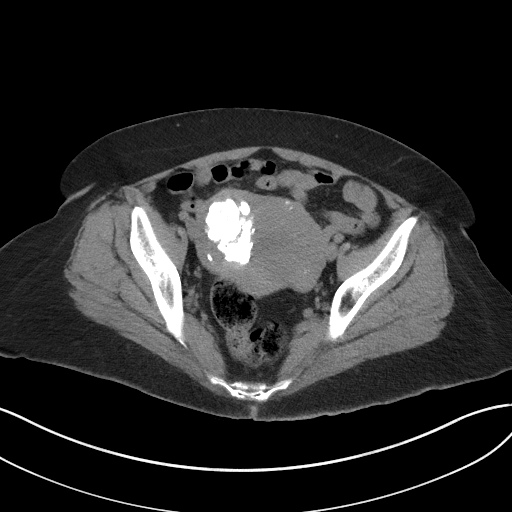
[im 35/98  soft-tissue]
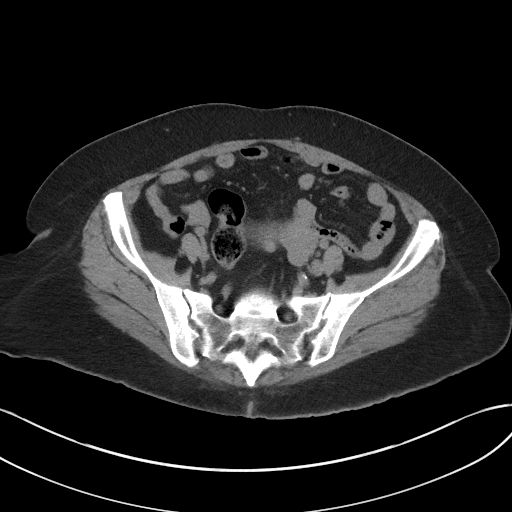
[im 40/98  soft-tissue]
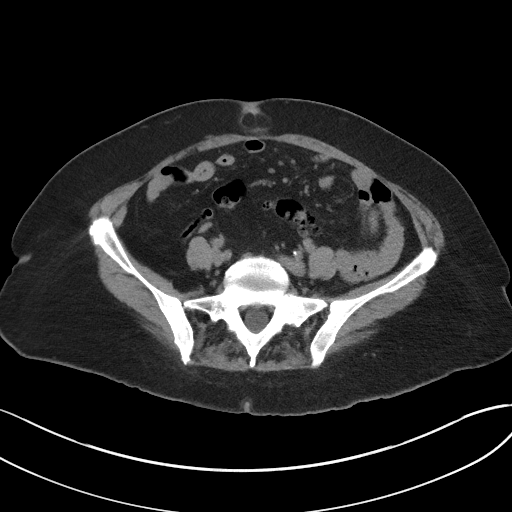
[im 52/98  soft-tissue]
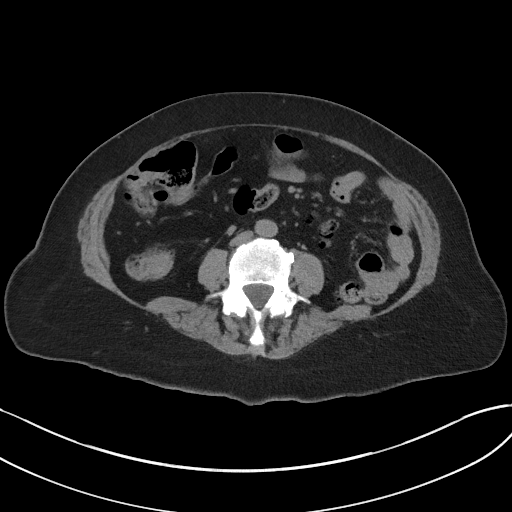
[im 58/98  soft-tissue]
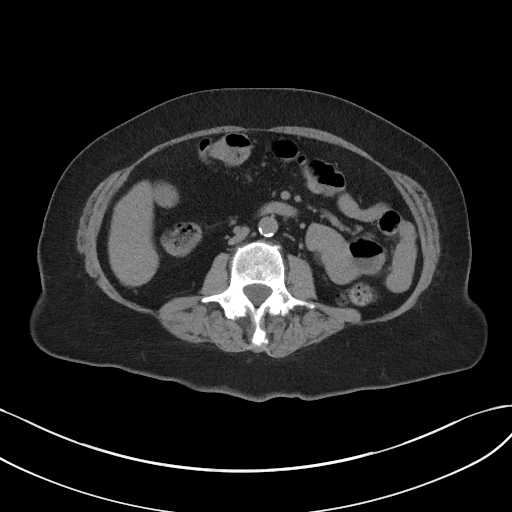
[im 63/98  soft-tissue]
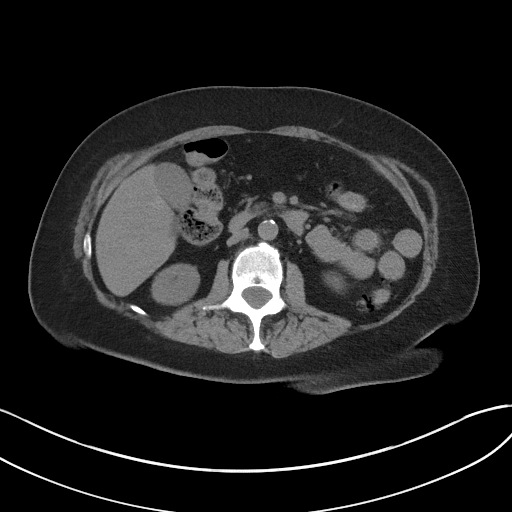
[im 63/98  bone]
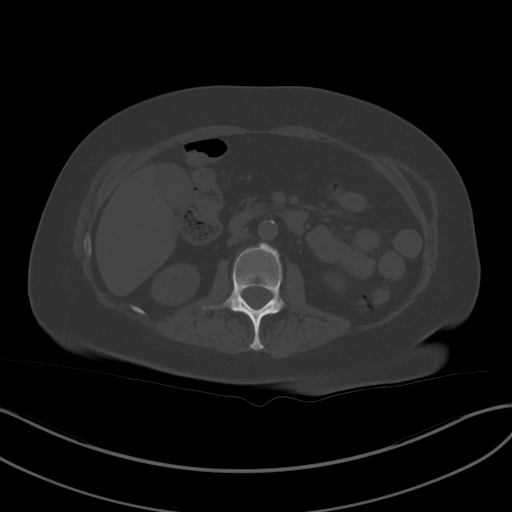
[im 69/98  soft-tissue]
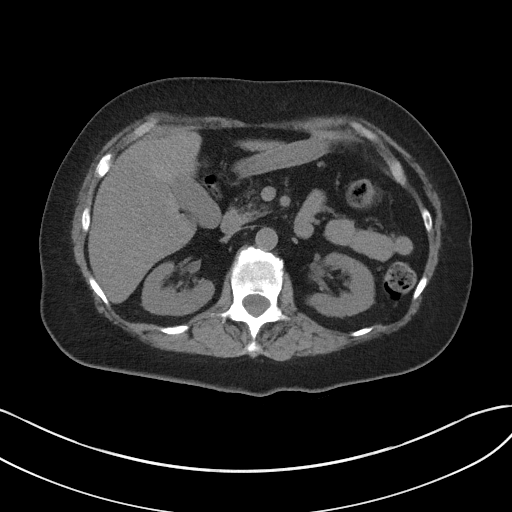
[im 75/98  soft-tissue]
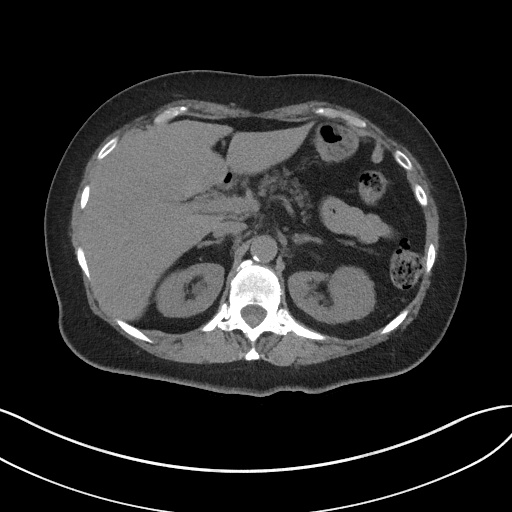
[im 86/98  soft-tissue]
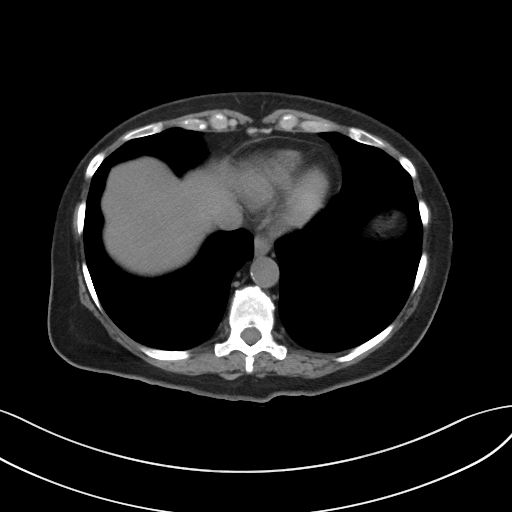
[im 92/98  soft-tissue]
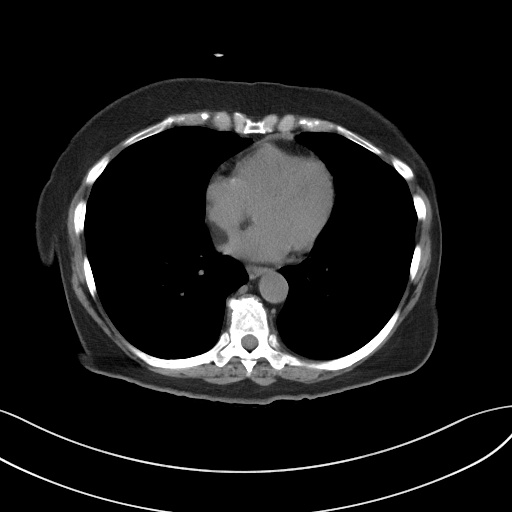

[Series 5: coronal st · coronal · 0.90mm/px · 3 of 90 slices shown]
[im 30/90  soft-tissue]
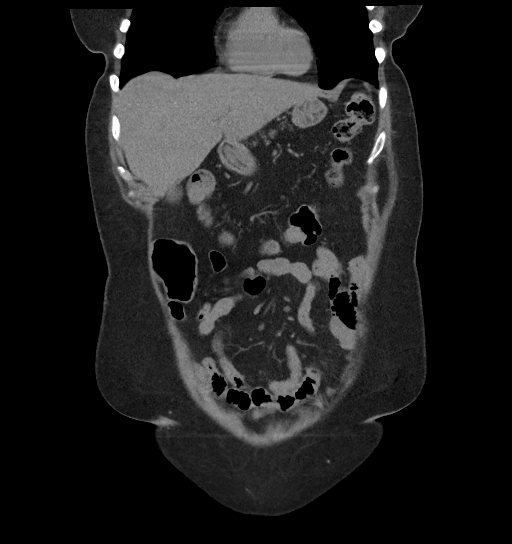
[im 40/90  soft-tissue]
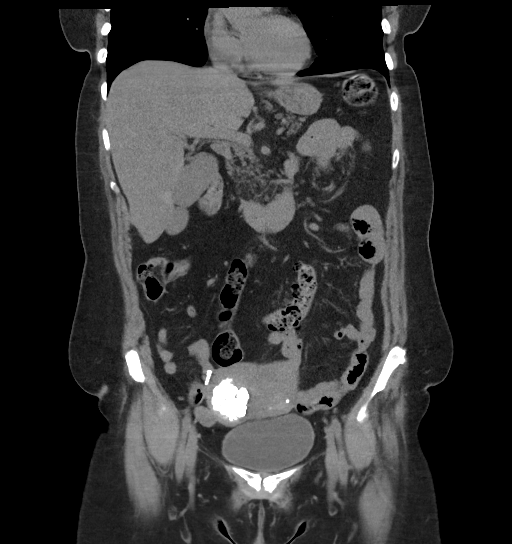
[im 50/90  soft-tissue]
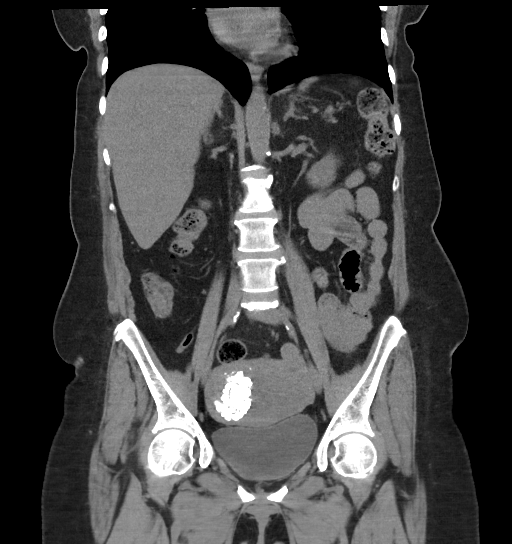

[16 of 46 positions shown; findings below may reference images not displayed]

FINDINGS: Lower chest: Coronary artery calcified atherosclerosis in the left
main artery, incompletely evaluated. The lower chest is otherwise
normal.

Hepatobiliary: There is low attenuation in the right hepatic lobe,
at least partially involving the gallbladder fossa. It does extend
more anteriorly and posteriorly. The liver is otherwise
unremarkable. The gallbladder is normal in appearance.

Pancreas: Unremarkable. No pancreatic ductal dilatation or
surrounding inflammatory changes.

Spleen: Normal in size without focal abnormality.

Adrenals/Urinary Tract: The adrenal glands are normal. No renal
stones identified. No hydronephrosis, perinephric stranding,
ureterectasis, or ureteral stone, or bladder abnormality.

Stomach/Bowel: The stomach and small bowel are normal. Colonic
diverticulosis is identified without diverticulitis. The remainder
of the colon is normal. The appendix is normal in appearance.

Vascular/Lymphatic: Calcified atherosclerosis in the nonaneurysmal
aorta. No adenopathy.

Reproductive: Calcified fibroids are seen in the uterus. The adnexa
are unremarkable.

Other: Fat containing periumbilical hernia is identified. No free
air free fluid.

Musculoskeletal: No acute or significant osseous findings.
IMPRESSION: 1. No acute cause for abdominal pain identified.
2. There is low attenuation in the right hepatic lobe, at least
partially involving the gallbladder fossa. It does extend more
anteriorly and posteriorly. The findings are nonspecific but could
be due to focal fatty infiltration. Recommend an MRI for further
evaluation.
3. Coronary artery calcifications, incompletely evaluated.
4. Calcified atherosclerosis in the nonaneurysmal aorta.
5. Colonic diverticulosis without diverticulitis.
6. Fat containing periumbilical hernia.
7. Calcified fibroids in the uterus.

Aortic Atherosclerosis ([id number]).

## 2023-04-23 ENCOUNTER — Other Ambulatory Visit: Payer: Self-pay

## 2023-04-23 ENCOUNTER — Emergency Department (HOSPITAL_COMMUNITY)

## 2023-04-23 ENCOUNTER — Inpatient Hospital Stay (HOSPITAL_COMMUNITY)
Admission: EM | Admit: 2023-04-23 | Discharge: 2023-04-27 | DRG: 872 | Disposition: A | Discharge status: Home / Self Care | Attending: Internal Medicine | Admitting: Internal Medicine

## 2023-04-23 ENCOUNTER — Encounter (HOSPITAL_COMMUNITY): Payer: Self-pay

## 2023-04-23 DIAGNOSIS — E1165 Type 2 diabetes mellitus with hyperglycemia: Secondary | ICD-10-CM | POA: Diagnosis present

## 2023-04-23 DIAGNOSIS — E876 Hypokalemia: Secondary | ICD-10-CM | POA: Diagnosis present

## 2023-04-23 DIAGNOSIS — E872 Acidosis, unspecified: Secondary | ICD-10-CM | POA: Diagnosis present

## 2023-04-23 DIAGNOSIS — Z7982 Long term (current) use of aspirin: Secondary | ICD-10-CM | POA: Diagnosis not present

## 2023-04-23 DIAGNOSIS — K759 Inflammatory liver disease, unspecified: Secondary | ICD-10-CM | POA: Diagnosis present

## 2023-04-23 DIAGNOSIS — R748 Abnormal levels of other serum enzymes: Secondary | ICD-10-CM | POA: Diagnosis not present

## 2023-04-23 DIAGNOSIS — E1122 Type 2 diabetes mellitus with diabetic chronic kidney disease: Secondary | ICD-10-CM | POA: Diagnosis present

## 2023-04-23 DIAGNOSIS — Z809 Family history of malignant neoplasm, unspecified: Secondary | ICD-10-CM | POA: Diagnosis not present

## 2023-04-23 DIAGNOSIS — J101 Influenza due to other identified influenza virus with other respiratory manifestations: Principal | ICD-10-CM | POA: Diagnosis present

## 2023-04-23 DIAGNOSIS — Z7984 Long term (current) use of oral hypoglycemic drugs: Secondary | ICD-10-CM

## 2023-04-23 DIAGNOSIS — I10 Essential (primary) hypertension: Secondary | ICD-10-CM | POA: Diagnosis not present

## 2023-04-23 DIAGNOSIS — R7989 Other specified abnormal findings of blood chemistry: Secondary | ICD-10-CM | POA: Diagnosis not present

## 2023-04-23 DIAGNOSIS — Z8673 Personal history of transient ischemic attack (TIA), and cerebral infarction without residual deficits: Secondary | ICD-10-CM

## 2023-04-23 DIAGNOSIS — A419 Sepsis, unspecified organism: Secondary | ICD-10-CM | POA: Diagnosis not present

## 2023-04-23 DIAGNOSIS — N1832 Chronic kidney disease, stage 3b: Secondary | ICD-10-CM | POA: Diagnosis present

## 2023-04-23 DIAGNOSIS — I129 Hypertensive chronic kidney disease with stage 1 through stage 4 chronic kidney disease, or unspecified chronic kidney disease: Secondary | ICD-10-CM | POA: Diagnosis present

## 2023-04-23 DIAGNOSIS — Z79899 Other long term (current) drug therapy: Secondary | ICD-10-CM

## 2023-04-23 DIAGNOSIS — Z794 Long term (current) use of insulin: Secondary | ICD-10-CM | POA: Diagnosis not present

## 2023-04-23 DIAGNOSIS — A4159 Other Gram-negative sepsis: Secondary | ICD-10-CM | POA: Diagnosis present

## 2023-04-23 DIAGNOSIS — R55 Syncope and collapse: Secondary | ICD-10-CM | POA: Diagnosis present

## 2023-04-23 LAB — URINALYSIS, W/ REFLEX TO CULTURE (INFECTION SUSPECTED)
Bacteria, UA: NONE SEEN
Bilirubin Urine: NEGATIVE
Glucose, UA: >=500 mg/dL — AB
Ketones, ur: NEGATIVE mg/dL
Nitrite: NEGATIVE
Protein, ur: 30 mg/dL — AB
RBC / HPF: >50 RBC/hpf (ref 0–5)
Specific Gravity, Urine: 1.013 (ref 1.005–1.030)
WBC, UA: >50 WBC/hpf (ref 0–5)
pH: 5 (ref 5.0–8.0)

## 2023-04-23 LAB — LIPASE, BLOOD: Lipase: 24 U/L (ref 11–51)

## 2023-04-23 LAB — CBC WITH DIFFERENTIAL/PLATELET
Abs Immature Granulocytes: 0.03 10*3/uL (ref 0.00–0.07)
Basophils Absolute: 0 10*3/uL (ref 0.0–0.1)
Basophils Relative: 0 %
Eosinophils Absolute: 0 10*3/uL (ref 0.0–0.5)
Eosinophils Relative: 0 %
HCT: 38.8 % (ref 36.0–46.0)
Hemoglobin: 13 g/dL (ref 12.0–15.0)
Immature Granulocytes: 0 %
Lymphocytes Relative: 3 %
Lymphs Abs: 0.2 10*3/uL — ABNORMAL LOW (ref 0.7–4.0)
MCH: 28.1 pg (ref 26.0–34.0)
MCHC: 33.5 g/dL (ref 30.0–36.0)
MCV: 84 fL (ref 80.0–100.0)
Monocytes Absolute: 0.1 10*3/uL (ref 0.1–1.0)
Monocytes Relative: 2 %
Neutro Abs: 7.8 10*3/uL — ABNORMAL HIGH (ref 1.7–7.7)
Neutrophils Relative %: 95 %
Platelets: 344 10*3/uL (ref 150–400)
RBC: 4.62 MIL/uL (ref 3.87–5.11)
RDW: 12.5 % (ref 11.5–15.5)
WBC: 8.2 10*3/uL (ref 4.0–10.5)
nRBC: 0 % (ref 0.0–0.2)

## 2023-04-23 LAB — COMPREHENSIVE METABOLIC PANEL WITH GFR
ALT: 171 U/L — ABNORMAL HIGH (ref 0–44)
AST: 264 U/L — ABNORMAL HIGH (ref 15–41)
Albumin: 3.6 g/dL (ref 3.5–5.0)
Alkaline Phosphatase: 132 U/L — ABNORMAL HIGH (ref 38–126)
Anion gap: 11 (ref 5–15)
BUN: 21 mg/dL (ref 8–23)
CO2: 19 mmol/L — ABNORMAL LOW (ref 22–32)
Calcium: 9.2 mg/dL (ref 8.9–10.3)
Chloride: 110 mmol/L (ref 98–111)
Creatinine, Ser: 1.41 mg/dL — ABNORMAL HIGH (ref 0.44–1.00)
GFR, Estimated: 38 mL/min — ABNORMAL LOW (ref >60)
Glucose, Bld: 177 mg/dL — ABNORMAL HIGH (ref 70–99)
Potassium: 3.4 mmol/L — ABNORMAL LOW (ref 3.5–5.1)
Sodium: 140 mmol/L (ref 135–145)
Total Bilirubin: 2.9 mg/dL — ABNORMAL HIGH (ref 0.0–1.2)
Total Protein: 6.8 g/dL (ref 6.5–8.1)

## 2023-04-23 LAB — RESP PANEL BY RT-PCR (RSV, FLU A&B, COVID)  RVPGX2
Influenza A by PCR: POSITIVE — AB
Influenza B by PCR: NEGATIVE
Resp Syncytial Virus by PCR: NEGATIVE
SARS Coronavirus 2 by RT PCR: NEGATIVE

## 2023-04-23 LAB — APTT: aPTT: 22 s — ABNORMAL LOW (ref 24–36)

## 2023-04-23 LAB — LACTIC ACID, PLASMA
Lactic Acid, Venous: 2.3 mmol/L (ref 0.5–1.9)
Lactic Acid, Venous: 2.2 mmol/L (ref 0.5–1.9)

## 2023-04-23 LAB — PROTIME-INR
INR: 1.1 (ref 0.8–1.2)
Prothrombin Time: 14 s (ref 11.4–15.2)

## 2023-04-23 MED ORDER — OXYCODONE HCL 5 MG PO TABS: 5.0000 mg | ORAL_TABLET | ORAL | Status: DC | PRN

## 2023-04-23 MED ORDER — ONDANSETRON HCL 4 MG/2ML IJ SOLN: 4.0000 mg | Freq: Four times a day (QID) | INTRAMUSCULAR | Status: DC | PRN

## 2023-04-23 MED ORDER — INSULIN ASPART 100 UNIT/ML IJ SOLN: 0.0000 [IU] | Freq: Every day | INTRAMUSCULAR | Status: DC

## 2023-04-23 MED ORDER — ENOXAPARIN SODIUM 40 MG/0.4ML IJ SOSY
40.0000 mg | PREFILLED_SYRINGE | INTRAMUSCULAR | Status: DC
  Administered 2023-04-24 – 2023-04-27 (×4): 40 mg via SUBCUTANEOUS
  Filled 2023-04-23 (×4): qty 0.4

## 2023-04-23 MED ORDER — INSULIN ASPART 100 UNIT/ML IJ SOLN
0.0000 [IU] | Freq: Three times a day (TID) | INTRAMUSCULAR | Status: DC
  Administered 2023-04-24 (×3): 1 [IU] via SUBCUTANEOUS
  Administered 2023-04-25: 2 [IU] via SUBCUTANEOUS
  Administered 2023-04-26 – 2023-04-27 (×4): 1 [IU] via SUBCUTANEOUS

## 2023-04-23 MED ORDER — ACETAMINOPHEN 325 MG PO TABS: 325.0000 mg | ORAL_TABLET | Freq: Four times a day (QID) | ORAL | Status: DC | PRN

## 2023-04-23 MED ORDER — LACTATED RINGERS IV BOLUS (SEPSIS)
500.0000 mL | Freq: Once | INTRAVENOUS | Status: AC
  Administered 2023-04-24: 500 mL via INTRAVENOUS

## 2023-04-23 MED ORDER — ACETAMINOPHEN 325 MG PO TABS
650.0000 mg | ORAL_TABLET | Freq: Once | ORAL | Status: AC
  Administered 2023-04-23: 650 mg via ORAL
  Filled 2023-04-23: qty 2

## 2023-04-23 MED ORDER — LACTATED RINGERS IV SOLN: INTRAVENOUS | Status: AC

## 2023-04-23 MED ORDER — SODIUM CHLORIDE 0.9 % IV SOLN
2.0000 g | Freq: Once | INTRAVENOUS | Status: AC
  Administered 2023-04-23: 2 g via INTRAVENOUS
  Filled 2023-04-23: qty 20

## 2023-04-23 MED ORDER — POTASSIUM CHLORIDE 10 MEQ/100ML IV SOLN
10.0000 meq | INTRAVENOUS | Status: DC
  Administered 2023-04-24: 10 meq via INTRAVENOUS
  Filled 2023-04-23: qty 100

## 2023-04-23 MED ORDER — OSELTAMIVIR PHOSPHATE 30 MG PO CAPS
30.0000 mg | ORAL_CAPSULE | Freq: Two times a day (BID) | ORAL | Status: DC
  Administered 2023-04-24 – 2023-04-27 (×7): 30 mg via ORAL
  Filled 2023-04-23 (×7): qty 1

## 2023-04-23 MED ORDER — ONDANSETRON HCL 4 MG PO TABS
4.0000 mg | ORAL_TABLET | Freq: Four times a day (QID) | ORAL | Status: DC | PRN
Start: 2023-04-23 — End: 2023-04-27

## 2023-04-23 MED ORDER — OSELTAMIVIR PHOSPHATE 75 MG PO CAPS
75.0000 mg | ORAL_CAPSULE | Freq: Once | ORAL | Status: AC
  Administered 2023-04-24: 75 mg via ORAL
  Filled 2023-04-23: qty 1

## 2023-04-23 MED ORDER — SENNOSIDES-DOCUSATE SODIUM 8.6-50 MG PO TABS: 1.0000 | ORAL_TABLET | Freq: Every evening | ORAL | Status: DC | PRN

## 2023-04-23 MED ORDER — IOHEXOL 300 MG/ML  SOLN
80.0000 mL | Freq: Once | INTRAMUSCULAR | Status: AC | PRN
  Administered 2023-04-23: 80 mL via INTRAVENOUS

## 2023-04-23 MED ORDER — SODIUM CHLORIDE 0.9% FLUSH
3.0000 mL | Freq: Two times a day (BID) | INTRAVENOUS | Status: DC
Start: 2023-04-23 — End: 2023-04-27
  Administered 2023-04-24 – 2023-04-27 (×7): 3 mL via INTRAVENOUS

## 2023-04-23 MED ORDER — ONDANSETRON HCL 4 MG/2ML IJ SOLN
4.0000 mg | Freq: Once | INTRAMUSCULAR | Status: AC
Start: 2023-04-23 — End: 2023-04-23
  Administered 2023-04-23: 4 mg via INTRAVENOUS
  Filled 2023-04-23: qty 2

## 2023-04-23 MED ORDER — SODIUM CHLORIDE 0.9 % IV BOLUS (SEPSIS)
2000.0000 mL | Freq: Once | INTRAVENOUS | Status: AC
  Administered 2023-04-23: 2000 mL via INTRAVENOUS

## 2023-04-23 MED ORDER — PIPERACILLIN-TAZOBACTAM 3.375 G IVPB
3.3750 g | Freq: Three times a day (TID) | INTRAVENOUS | Status: DC
  Administered 2023-04-24 – 2023-04-26 (×9): 3.375 g via INTRAVENOUS
  Filled 2023-04-23 (×10): qty 50
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
## 2023-04-23 NOTE — ED Provider Notes (Signed)
 [place] EMERGENCY DEPARTMENT AT [facility] Provider Note   CSN: [id number] Arrival date & time: 04/23/23  1849     History  Chief Complaint  Patient presents with   Blurred Vision    Martha Ray is a 78 y.o. female.  Patient states she had some blurry vision.  She also complains of significant weakness.  She has a history of hypertension and diabetes  The history is provided by the patient and medical records.  Weakness Severity:  Moderate Onset quality:  Sudden Timing:  Intermittent Progression:  Waxing and waning Chronicity:  New Context: not alcohol use   Relieved by:  Nothing Worsened by:  Nothing Ineffective treatments:  None tried Associated symptoms: no abdominal pain, no chest pain, no cough, no diarrhea, no frequency, no headaches and no seizures        Home Medications Prior to Admission medications   Medication Sig Start Date End Date Taking? Authorizing Provider  aspirin 325 MG tablet Take 1 tablet (325 mg total) by mouth daily. 08/22/14   Philip Aspen, Limmie Patricia, MD  atorvastatin (LIPITOR) 20 MG tablet Take 1 tablet (20 mg total) by mouth daily at 6 PM. 08/22/14   Philip Aspen, Limmie Patricia, MD  blood glucose meter kit and supplies KIT 1 each by Does not apply route 4 (four) times daily. Dispense based on patient and insurance preference. Use up to four times daily as directed. (FOR ICD-9 250.00, 250.01). 02/19/20   Roma Kayser, MD  Blood Glucose Monitoring Suppl (ACCU-CHEK GUIDE ME) w/Device KIT Test 4 x daily 02/14/20   Roma Kayser, MD  co-enzyme Q-10 50 MG capsule Take 50 mg by mouth daily.    [provider]  glucose blood (ACCU-CHEK GUIDE) test strip Use as instructed 4 x daily. Dx E11.65 02/14/20   Roma Kayser, MD  hydrOXYzine (ATARAX) 25 MG tablet Take 25 mg by mouth at bedtime as needed for anxiety.    [provider]  insulin detemir (LEVEMIR FLEXTOUCH) 100 UNIT/ML FlexPen Inject 20  Units into the skin daily. 05/29/20   Roma Kayser, MD  Insulin Pen Needle (B-D ULTRAFINE III SHORT PEN) 31G X 8 MM MISC 1 each by Does not apply route as directed. 02/21/20   Roma Kayser, MD  JANUVIA 50 MG tablet Take 50 mg by mouth daily. 02/08/20   [provider]  JARDIANCE 10 MG TABS tablet Take 10 mg by mouth daily.    [provider]  LANTUS SOLOSTAR 100 UNIT/ML Solostar Pen Inject 30-35 Units into the skin daily. 10/19/22   [provider]  losartan (COZAAR) 50 MG tablet Take 50 mg by mouth daily.    [provider]  losartan-hydrochlorothiazide (HYZAAR) 50-12.5 MG per tablet Take 1 tablet by mouth daily.    [provider]  metFORMIN (GLUCOPHAGE) 500 MG tablet Take 500 mg by mouth daily with breakfast.    [provider]  Multiple Vitamins-Minerals (CENTRUM ADULTS PO) Take 1 capsule by mouth daily.    [provider]      Allergies    Patient has no known allergies.    Review of Systems   Review of Systems  Constitutional:  Negative for appetite change and fatigue.  HENT:  Negative for congestion, ear discharge and sinus pressure.   Eyes:  Negative for discharge.  Respiratory:  Negative for cough.   Cardiovascular:  Negative for chest pain.  Gastrointestinal:  Negative for abdominal pain and diarrhea.  Genitourinary:  Negative for frequency and hematuria.  Musculoskeletal:  Negative for back pain.  Skin:  Negative for rash.  Neurological:  Positive for weakness. Negative for seizures and headaches.  Psychiatric/Behavioral:  Negative for hallucinations.     Physical Exam Updated Vital Signs BP 129/67   Pulse (!) 117   Temp (!) 101.3 F (38.5 C) (Oral)   Resp 19   Ht 5\' 5"  (1.651 m)   Wt 74.4 kg   SpO2 97%   BMI 27.29 kg/m  Physical Exam Vitals and nursing note reviewed.  Constitutional:      Appearance: She is well-developed.  HENT:     Head: Normocephalic.     Nose: Nose normal.   Eyes:     General: No scleral icterus.    Conjunctiva/sclera: Conjunctivae normal.  Neck:     Thyroid: No thyromegaly.  Cardiovascular:     Rate and Rhythm: Normal rate and regular rhythm.     Heart sounds: No murmur heard.    No friction rub. No gallop.  Pulmonary:     Breath sounds: No stridor. No wheezing or rales.  Chest:     Chest wall: No tenderness.  Abdominal:     General: There is no distension.     Tenderness: There is no abdominal tenderness. There is no rebound.  Musculoskeletal:        General: Normal range of motion.     Cervical back: Neck supple.  Lymphadenopathy:     Cervical: No cervical adenopathy.  Skin:    Findings: No erythema or rash.  Neurological:     Mental Status: She is alert and oriented to person, place, and time.     Motor: No abnormal muscle tone.     Coordination: Coordination normal.  Psychiatric:        Behavior: Behavior normal.     ED Results / Procedures / Treatments   Labs (all labs ordered are listed, but only abnormal results are displayed) Labs Reviewed  RESP PANEL BY RT-PCR (RSV, FLU A&B, COVID)  RVPGX2 - Abnormal; Notable for the following components:      Result Value   Influenza A by PCR POSITIVE (*)    All other components within normal limits  LACTIC ACID, PLASMA - Abnormal; Notable for the following components:   Lactic Acid, Venous 2.3 (*)    All other components within normal limits  LACTIC ACID, PLASMA - Abnormal; Notable for the following components:   Lactic Acid, Venous 2.2 (*)    All other components within normal limits  COMPREHENSIVE METABOLIC PANEL WITH GFR - Abnormal; Notable for the following components:   Potassium 3.4 (*)    CO2 19 (*)    Glucose, Bld 177 (*)    Creatinine, Ser 1.41 (*)    AST 264 (*)    ALT 171 (*)    Alkaline Phosphatase 132 (*)    Total Bilirubin 2.9 (*)    GFR, Estimated 38 (*)    All other components within normal limits  CBC WITH DIFFERENTIAL/PLATELET - Abnormal; Notable  for the following components:   Neutro Abs 7.8 (*)    Lymphs Abs 0.2 (*)    All other components within normal limits  APTT - Abnormal; Notable for the following components:   aPTT 22 (*)    All other components within normal limits  URINALYSIS, W/ REFLEX TO CULTURE (INFECTION SUSPECTED) - Abnormal; Notable for the following components:   APPearance HAZY (*)    Glucose, UA >=500 (*)  Hgb urine dipstick MODERATE (*)    Protein, ur 30 (*)    Leukocytes,Ua LARGE (*)    Non Squamous Epithelial 11-20 (*)    All other components within normal limits  CULTURE, BLOOD (ROUTINE X 2)  CULTURE, BLOOD (ROUTINE X 2)  URINE CULTURE  PROTIME-INR  LIPASE, BLOOD  HEPATITIS PANEL, ACUTE    EKG None  Radiology CT ABDOMEN PELVIS W CONTRAST Result Date: 04/23/2023 CLINICAL DATA:  Acute nonlocalized abdominal pain. Vomiting after eating port. EXAM: CT ABDOMEN AND PELVIS WITH CONTRAST TECHNIQUE: Multidetector CT imaging of the abdomen and pelvis was performed using the standard protocol following bolus administration of intravenous contrast. RADIATION DOSE REDUCTION: This exam was performed according to the departmental dose-optimization program which includes automated exposure control, adjustment of the mA and/or kV according to patient size and/or use of iterative reconstruction technique. CONTRAST:  80mL OMNIPAQUE IOHEXOL 300 MG/ML  SOLN COMPARISON:  02/04/2020 FINDINGS: Lower chest: No acute abnormality. Hepatobiliary: Periportal edema. Mild gallbladder wall thickening. No radiopaque stone or biliary dilation. Pancreas: Unremarkable. Spleen: Unremarkable. Adrenals/Urinary Tract: Normal adrenal glands. No urinary calculi or hydronephrosis. Unremarkable bladder. Stomach/Bowel: Normal caliber large and small bowel. Colonic diverticulosis without diverticulitis. Stomach and appendix are within normal limits. Vascular/Lymphatic: Patent portal vein. Aortic atherosclerotic calcification. No lymphadenopathy.  Reproductive: Multiple uterine fibroids some of which contain calcifications. No adnexal mass. Tubal ligation clips. Other: No free intraperitoneal fluid or air. Musculoskeletal: No acute fracture. IMPRESSION: 1. Periportal edema and mild gallbladder wall thickening. Findings are nonspecific but can be seen in the setting of hepatitis. 2. Colonic diverticulosis without diverticulitis. 3. Uterine fibroids. 4. Aortic Atherosclerosis (ICD10-I70.0). Electronically Signed   By: Minerva Fester M.D.   On: 04/23/2023 22:44   DG Chest Port 1 View Result Date: 04/23/2023 CLINICAL DATA:  Questionable sepsis - evaluate for abnormality EXAM: PORTABLE CHEST 1 VIEW COMPARISON:  Chest radiograph 08/20/2014 FINDINGS: The cardiomediastinal contours are normal. The lungs are clear. Pulmonary vasculature is normal. No consolidation, pleural effusion, or pneumothorax. No acute osseous abnormalities are seen. IMPRESSION: No active disease. Electronically Signed   By: Narda Rutherford M.D.   On: 04/23/2023 21:06    Procedures Procedures    Medications Ordered in ED Medications  cefTRIAXone (ROCEPHIN) 2 g in sodium chloride 0.9 % 100 mL IVPB (2 g Intravenous New Bag/Given 04/23/23 2042)  sodium chloride 0.9 % bolus 2,000 mL (2,000 mLs Intravenous New Bag/Given 04/23/23 2041)  ondansetron (ZOFRAN) injection 4 mg (4 mg Intravenous Given 04/23/23 2042)  acetaminophen (TYLENOL) tablet 650 mg (650 mg Oral Given 04/23/23 2043)  iohexol (OMNIPAQUE) 300 MG/ML solution 80 mL (80 mLs Intravenous Contrast Given 04/23/23 2224)    ED Course/ Medical Decision Making/ A&P   This patient presents to the ED for concern of weakness, this involves an extensive number of treatment options, and is a complaint that carries with it a high risk of complications and morbidity.  The differential diagnosis includes sepsis, pneumonia   Co morbidities that complicate the patient evaluation  Hypertension and diabetes   Additional history  obtained:  Additional history obtained from patient, External records from outside source obtained and reviewed including hospital records   Lab Tests:  I Ordered, and personally interpreted labs.  The pertinent results include: Influenza positive   Imaging Studies ordered:  I ordered imaging studies including CT abdomen I independently visualized and interpreted imaging which showed possible hepatitis I agree with the radiologist interpretation   Cardiac Monitoring: / EKG:  The patient was maintained  on a cardiac monitor.  I personally viewed and interpreted the cardiac monitored which showed an underlying rhythm of: Normal sinus rhythm   Consultations Obtained:  I requested consultation with the hospitalist,  and discussed lab and imaging findings as well as pertinent plan - they recommend: Admit   Problem List / ED Course / Critical interventions / Medication management  Influenza A and hypertension and elevated liver enzymes I ordered medication including normal saline for dehydration Reevaluation of the patient after these medicines showed that the patient improved I have reviewed the patients home medicines and have made adjustments as needed   Social Determinants of Health:  No   Test / Admission - Considered:  None  Click here for ABCD2, HEART and other calculatorsREFRESH Note before signing :1}                              Medical Decision Making Amount and/or Complexity of Data Reviewed Labs: ordered. Radiology: ordered.  Risk OTC drugs. Prescription drug management. Decision regarding hospitalization.   Patient is being admitted for persistent tachycardia with influenza A and hepatitis.        Final Clinical Impression(s) / ED Diagnoses Final diagnoses:  Influenza A  Hepatitis    Rx / DC Orders ED Discharge Orders     None         Bethann Berkshire, MD 04/24/23 [phone]
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
## 2023-04-23 NOTE — Progress Notes (Signed)
 Pharmacy Antibiotic Note  IRISH BREISCH is a 78 y.o. female admitted on 04/23/2023 with intra-abdominal infection.  Pharmacy has been consulted for Zosyn dosing.  Plan: Zosyn 3.375g IV q8h (4-hour infusion).  Height: 5\' 5"  (165.1 cm) Weight: 74.4 kg (164 lb) IBW/kg (Calculated) : 57  Temp (24hrs), Avg:100.4 F (38 C), Min:99.8 F (37.7 C), Max:101.3 F (38.5 C)  Recent Labs  Lab 04/23/23 2041 04/23/23 2207  WBC 8.2  --   CREATININE 1.41*  --   LATICACIDVEN 2.3* 2.2*    Estimated Creatinine Clearance: 33.8 mL/min (A) (by C-G formula based on SCr of 1.41 mg/dL (H)).    No Known Allergies  Thank you for allowing pharmacy to be a part of this patient's care.  Vernard Gambles, PharmD, BCPS  04/23/2023 11:51 PM

## 2023-04-23 NOTE — Progress Notes (Signed)
 CODE SEPSIS - PHARMACY COMMUNICATION  **Broad Spectrum Antibiotics should be administered within 1 hour of Sepsis diagnosis**  Time Code Sepsis Called/Page Received: 2020  Antibiotics Ordered: ceftriaxone  Time of 1st antibiotic administration: 2042  Additional action taken by pharmacy:    If necessary, Name of Provider/Nurse Contacted:      Angelique Blonder ,PharmD Clinical Pharmacist  04/23/2023  8:49 PM

## 2023-04-23 NOTE — Progress Notes (Signed)
 Elink monitoring for the code sepsis protocol.
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
## 2023-04-24 ENCOUNTER — Encounter (HOSPITAL_COMMUNITY): Payer: Self-pay | Admitting: Radiology

## 2023-04-24 ENCOUNTER — Inpatient Hospital Stay (HOSPITAL_COMMUNITY)

## 2023-04-24 DIAGNOSIS — I10 Essential (primary) hypertension: Secondary | ICD-10-CM | POA: Diagnosis not present

## 2023-04-24 DIAGNOSIS — A419 Sepsis, unspecified organism: Secondary | ICD-10-CM

## 2023-04-24 DIAGNOSIS — J101 Influenza due to other identified influenza virus with other respiratory manifestations: Secondary | ICD-10-CM

## 2023-04-24 DIAGNOSIS — N1832 Chronic kidney disease, stage 3b: Secondary | ICD-10-CM

## 2023-04-24 DIAGNOSIS — R7989 Other specified abnormal findings of blood chemistry: Secondary | ICD-10-CM

## 2023-04-24 DIAGNOSIS — E1122 Type 2 diabetes mellitus with diabetic chronic kidney disease: Secondary | ICD-10-CM

## 2023-04-24 DIAGNOSIS — Z794 Long term (current) use of insulin: Secondary | ICD-10-CM

## 2023-04-24 LAB — BLOOD CULTURE ID PANEL (REFLEXED) - BCID2

## 2023-04-24 LAB — CBC
HCT: 36.3 % (ref 36.0–46.0)
Hemoglobin: 11.9 g/dL — ABNORMAL LOW (ref 12.0–15.0)
MCH: 27.6 pg (ref 26.0–34.0)
MCHC: 32.8 g/dL (ref 30.0–36.0)
MCV: 84.2 fL (ref 80.0–100.0)
Platelets: 350 10*3/uL (ref 150–400)
RBC: 4.31 MIL/uL (ref 3.87–5.11)
RDW: 12.3 % (ref 11.5–15.5)
WBC: 17.8 10*3/uL — ABNORMAL HIGH (ref 4.0–10.5)
nRBC: 0 % (ref 0.0–0.2)

## 2023-04-24 LAB — COMPREHENSIVE METABOLIC PANEL WITH GFR
ALT: 211 U/L — ABNORMAL HIGH (ref 0–44)
AST: 246 U/L — ABNORMAL HIGH (ref 15–41)
Albumin: 2.7 g/dL — ABNORMAL LOW (ref 3.5–5.0)
Alkaline Phosphatase: 108 U/L (ref 38–126)
Anion gap: 8 (ref 5–15)
BUN: 17 mg/dL (ref 8–23)
CO2: 19 mmol/L — ABNORMAL LOW (ref 22–32)
Calcium: 8.1 mg/dL — ABNORMAL LOW (ref 8.9–10.3)
Chloride: 115 mmol/L — ABNORMAL HIGH (ref 98–111)
Creatinine, Ser: 1.27 mg/dL — ABNORMAL HIGH (ref 0.44–1.00)
GFR, Estimated: 44 mL/min — ABNORMAL LOW (ref >60)
Glucose, Bld: 122 mg/dL — ABNORMAL HIGH (ref 70–99)
Potassium: 3.4 mmol/L — ABNORMAL LOW (ref 3.5–5.1)
Sodium: 142 mmol/L (ref 135–145)
Total Bilirubin: 3 mg/dL — ABNORMAL HIGH (ref 0.0–1.2)
Total Protein: 5.3 g/dL — ABNORMAL LOW (ref 6.5–8.1)

## 2023-04-24 LAB — GLUCOSE, CAPILLARY
Glucose-Capillary: 124 mg/dL — ABNORMAL HIGH (ref 70–99)
Glucose-Capillary: 153 mg/dL — ABNORMAL HIGH (ref 70–99)
Glucose-Capillary: 165 mg/dL — ABNORMAL HIGH (ref 70–99)
Glucose-Capillary: 168 mg/dL — ABNORMAL HIGH (ref 70–99)
Glucose-Capillary: 184 mg/dL — ABNORMAL HIGH (ref 70–99)

## 2023-04-24 LAB — HEPATITIS PANEL, ACUTE
HCV Ab: NONREACTIVE
Hep A IgM: NONREACTIVE
Hep B C IgM: NONREACTIVE
Hepatitis B Surface Ag: NONREACTIVE

## 2023-04-24 LAB — PROCALCITONIN: Procalcitonin: 16.48 ng/mL

## 2023-04-24 LAB — MAGNESIUM: Magnesium: 1.5 mg/dL — ABNORMAL LOW (ref 1.7–2.4)

## 2023-04-24 LAB — LACTIC ACID, PLASMA: Lactic Acid, Venous: 1.4 mmol/L (ref 0.5–1.9)

## 2023-04-24 MED ORDER — MAGNESIUM SULFATE IN D5W 1-5 GM/100ML-% IV SOLN
1.0000 g | Freq: Once | INTRAVENOUS | Status: AC
  Administered 2023-04-24: 1 g via INTRAVENOUS
  Filled 2023-04-24: qty 100

## 2023-04-24 MED ORDER — GADOBUTROL 1 MMOL/ML IV SOLN
7.5000 mL | Freq: Once | INTRAVENOUS | Status: AC | PRN
  Administered 2023-04-24: 7.5 mL via INTRAVENOUS

## 2023-04-24 MED ORDER — POTASSIUM CHLORIDE CRYS ER 20 MEQ PO TBCR
20.0000 meq | EXTENDED_RELEASE_TABLET | Freq: Once | ORAL | Status: AC
  Administered 2023-04-24: 20 meq via ORAL
  Filled 2023-04-24: qty 1

## 2023-04-24 MED ORDER — POTASSIUM CHLORIDE CRYS ER 20 MEQ PO TBCR
40.0000 meq | EXTENDED_RELEASE_TABLET | Freq: Once | ORAL | Status: AC
  Administered 2023-04-24: 40 meq via ORAL
  Filled 2023-04-24: qty 2

## 2023-04-24 NOTE — Plan of Care (Signed)

## 2023-04-24 NOTE — Progress Notes (Signed)
   04/24/23 1517  TOC Brief Assessment  Insurance and Status Reviewed  Patient has primary care physician Yes  Home environment has been reviewed Single family home  Prior level of function: Independent  Prior/Current Home Services No current home services  Transition of care needs no transition of care needs at this time   Transition of Care Department [facility]) has reviewed patient and no TOC needs have been identified at this time. We will continue

## 2023-04-24 NOTE — Progress Notes (Signed)
 Mobility Specialist Progress Note:    04/24/23 1055  Mobility  Activity Ambulated with assistance in hallway  Level of Assistance Standby assist, set-up cues, supervision of patient - no hands on  Assistive Device None  Distance Ambulated (ft) 200 ft  Range of Motion/Exercises Active;All extremities  Activity Response Tolerated well  Mobility Referral Yes  Mobility visit 1 Mobility  Mobility Specialist Start Time (ACUTE ONLY) 1055  Mobility Specialist Stop Time (ACUTE ONLY) 1120  Mobility Specialist Time Calculation (min) (ACUTE ONLY) 25 min   Pt received in bed, agreeable to mobility. Required supervision to stand and ambulate with no AD. Tolerated well, asx throughout. Left pt sitting EOB, all needs met.  Ellanore Vanhook Mobility Specialist Please contact via Special educational needs teacher or  Rehab office at [phone]
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
## 2023-04-24 NOTE — Progress Notes (Signed)
   04/24/23 0022  Assess: MEWS Score  Temp 98.4 F (36.9 C)  BP 133/71  MAP (mmHg) 85  Pulse Rate (!) 119  Resp 20  SpO2 100 %  Assess: MEWS Score  MEWS Temp 0  MEWS Systolic 0  MEWS Pulse 2  MEWS RR 0  MEWS LOC 0  MEWS Score 2  MEWS Score Color Yellow  Assess: if the MEWS score is Yellow or Red  Were vital signs accurate and taken at a resting state? Yes  Does the patient meet 2 or more of the SIRS criteria? Yes  Does the patient have a confirmed or suspected source of infection? Yes  MEWS guidelines implemented  Yes, yellow  Treat  MEWS Interventions Considered administering scheduled or prn medications/treatments as ordered  Take Vital Signs  Increase Vital Sign Frequency  Yellow: Q2hr x1, continue Q4hrs until patient remains green for 12hrs  Escalate  MEWS: Escalate Yellow: Discuss with charge nurse and consider notifying provider and/or RRT  Notify: Charge Nurse/RN  Name of Charge Nurse/RN Notified [place], RN  Assess: SIRS CRITERIA  SIRS Temperature  0  SIRS Respirations  0  SIRS Pulse 1  SIRS WBC 0  SIRS Score Sum  1

## 2023-04-25 DIAGNOSIS — I10 Essential (primary) hypertension: Secondary | ICD-10-CM | POA: Diagnosis not present

## 2023-04-25 DIAGNOSIS — A419 Sepsis, unspecified organism: Secondary | ICD-10-CM | POA: Diagnosis not present

## 2023-04-25 DIAGNOSIS — R7989 Other specified abnormal findings of blood chemistry: Secondary | ICD-10-CM | POA: Diagnosis not present

## 2023-04-25 DIAGNOSIS — J101 Influenza due to other identified influenza virus with other respiratory manifestations: Secondary | ICD-10-CM | POA: Diagnosis not present

## 2023-04-25 DIAGNOSIS — K759 Inflammatory liver disease, unspecified: Secondary | ICD-10-CM

## 2023-04-25 DIAGNOSIS — N1832 Chronic kidney disease, stage 3b: Secondary | ICD-10-CM | POA: Diagnosis not present

## 2023-04-25 LAB — GLUCOSE, CAPILLARY
Glucose-Capillary: 147 mg/dL — ABNORMAL HIGH (ref 70–99)
Glucose-Capillary: 213 mg/dL — ABNORMAL HIGH (ref 70–99)
Glucose-Capillary: 150 mg/dL — ABNORMAL HIGH (ref 70–99)
Glucose-Capillary: 187 mg/dL — ABNORMAL HIGH (ref 70–99)

## 2023-04-25 LAB — URINE CULTURE: Culture: <10000 — AB

## 2023-04-25 LAB — CBC
HCT: 36.4 % (ref 36.0–46.0)
Hemoglobin: 12.2 g/dL (ref 12.0–15.0)
MCH: 27.9 pg (ref 26.0–34.0)
MCHC: 33.5 g/dL (ref 30.0–36.0)
MCV: 83.1 fL (ref 80.0–100.0)
Platelets: 339 10*3/uL (ref 150–400)
RBC: 4.38 MIL/uL (ref 3.87–5.11)
RDW: 12.8 % (ref 11.5–15.5)
WBC: 12.6 10*3/uL — ABNORMAL HIGH (ref 4.0–10.5)
nRBC: 0 % (ref 0.0–0.2)

## 2023-04-25 LAB — BILIRUBIN, DIRECT: Bilirubin, Direct: 2.9 mg/dL — ABNORMAL HIGH (ref 0.0–0.2)

## 2023-04-25 LAB — COMPREHENSIVE METABOLIC PANEL WITH GFR
ALT: 176 U/L — ABNORMAL HIGH (ref 0–44)
AST: 119 U/L — ABNORMAL HIGH (ref 15–41)
Albumin: 2.7 g/dL — ABNORMAL LOW (ref 3.5–5.0)
Alkaline Phosphatase: 115 U/L (ref 38–126)
Anion gap: 9 (ref 5–15)
BUN: 11 mg/dL (ref 8–23)
CO2: 18 mmol/L — ABNORMAL LOW (ref 22–32)
Calcium: 8.8 mg/dL — ABNORMAL LOW (ref 8.9–10.3)
Chloride: 116 mmol/L — ABNORMAL HIGH (ref 98–111)
Creatinine, Ser: 1.37 mg/dL — ABNORMAL HIGH (ref 0.44–1.00)
GFR, Estimated: 40 mL/min — ABNORMAL LOW (ref >60)
Glucose, Bld: 138 mg/dL — ABNORMAL HIGH (ref 70–99)
Potassium: 4.1 mmol/L (ref 3.5–5.1)
Sodium: 143 mmol/L (ref 135–145)
Total Bilirubin: 4.9 mg/dL — ABNORMAL HIGH (ref 0.0–1.2)
Total Protein: 5.8 g/dL — ABNORMAL LOW (ref 6.5–8.1)

## 2023-04-25 LAB — LIPASE, BLOOD: Lipase: 20 U/L (ref 11–51)

## 2023-04-25 NOTE — Consult Note (Signed)
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 chest: Small bilateral pleural effusions. Hepatobiliary: Mild hepatic steatosis. No enhancing liver abnormality. Sludge is seen layering within the dependent portions of the gallbladder. Mild gallbladder wall thickening is noted measuring 5 mm. No gallstones identified. No common bile duct or intrahepatic bile duct dilatation. Abrupt termination of the normal caliber distal common bile duct is noted on the MRCP images. This is a nonspecific finding. No common bile duct  stones noted proximal to this level. Pancreas: No mass, inflammatory changes, or other parenchymal abnormality identified. Spleen:  Within normal limits in size and appearance. Adrenals/Urinary Tract: Normal adrenal glands. No kidney mass. Multiple bilateral parapelvic cysts noted. Bilateral perinephric soft tissue stranding, nonspecific. Stomach/Bowel: Visualized portions within the abdomen are unremarkable. Vascular/Lymphatic: Normal caliber of the abdominal aorta. Patent abdominal vascularity. Other:  No significant free fluid.  No fluid collections. Musculoskeletal: No suspicious bone lesions identified. IMPRESSION: 1. Sludge is seen layering within the dependent portions of the gallbladder. Mild gallbladder wall thickening is noted measuring 5 mm. No gallstones identified. Findings are equivocal for acute cholecystitis. If there is clinical concern for acute cholecystitis consider further evaluation with HIDA scan. 2. No common bile duct or intrahepatic bile duct dilatation. Abrupt termination of the normal caliber distal common bile duct is noted on the MRCP images. This is a nonspecific finding. No common bile duct stones noted proximal to this level. Small stones within the distal  common bile duct would be difficult to exclude with a high degree of certainty. 3. Mild hepatic steatosis. 4. Small bilateral pleural effusions. Electronically Signed   By: Signa Kell M.D.   On: 04/24/2023 13:47   CT ABDOMEN PELVIS W CONTRAST Result Date: 04/23/2023 CLINICAL DATA:  Acute nonlocalized abdominal pain. Vomiting after eating port. EXAM: CT ABDOMEN AND PELVIS WITH CONTRAST TECHNIQUE: Multidetector CT imaging of the abdomen and pelvis was performed using the standard protocol following bolus administration of intravenous contrast. RADIATION DOSE REDUCTION: This exam was performed according to the departmental dose-optimization program which includes automated exposure control, adjustment of the mA and/or kV  according to patient size and/or use of iterative reconstruction technique. CONTRAST:  80mL OMNIPAQUE IOHEXOL 300 MG/ML  SOLN COMPARISON:  02/04/2020 FINDINGS: Lower chest: No acute abnormality. Hepatobiliary: Periportal edema. Mild gallbladder wall thickening. No radiopaque stone or biliary dilation. Pancreas: Unremarkable. Spleen: Unremarkable. Adrenals/Urinary Tract: Normal adrenal glands. No urinary calculi or hydronephrosis. Unremarkable bladder. Stomach/Bowel: Normal caliber large and small bowel. Colonic diverticulosis without diverticulitis. Stomach and appendix are within normal limits. Vascular/Lymphatic: Patent portal vein. Aortic atherosclerotic calcification. No lymphadenopathy. Reproductive: Multiple uterine fibroids some of which contain calcifications. No adnexal mass. Tubal ligation clips. Other: No free intraperitoneal fluid or air. Musculoskeletal: No acute fracture. IMPRESSION: 1. Periportal edema and mild gallbladder wall thickening. Findings are nonspecific but can be seen in the setting of hepatitis. 2. Colonic diverticulosis without diverticulitis. 3. Uterine fibroids. 4. Aortic Atherosclerosis (ICD10-I70.0). Electronically Signed   By: Minerva Fester M.D.   On: 04/23/2023 22:44   DG Chest Port 1 View Result Date: 04/23/2023 CLINICAL DATA:  Questionable sepsis - evaluate for abnormality EXAM: PORTABLE CHEST 1 VIEW COMPARISON:  Chest radiograph 08/20/2014 FINDINGS: The cardiomediastinal contours are normal. The lungs are clear. Pulmonary vasculature is normal. No consolidation, pleural effusion, or pneumothorax. No acute osseous abnormalities are seen. IMPRESSION: No active disease. Electronically Signed   By: Narda Rutherford M.D.   On: 04/23/2023 21:06    ROS:  Pertinent items are noted in HPI.  Blood pressure (!) 157/73, pulse 94, temperature 98.9 F (37.2 C), temperature source Oral, resp. rate 16, height 5\' 5"  (1.651 m), weight 74.4 kg, SpO2 99%. Physical Exam: Pleasant black  female no acute distress Head is normocephalic, atraumatic Lungs clear to auscultation with equal breath sounds bilaterally Heart examination reveals a regular rate and rhythm without S3, S4, murmurs Abdomen is soft, nontender, nondistended.  No right upper quadrant abdominal pain is elicited on physical examination.  CT scan images personally reviewed.  Today's labs reviewed.  Assessment/Plan: Impression: Transaminitis of unknown etiology.  She also has a blood culture which is positive for multiple GI organisms.  Patient may have had an episode of cholecystitis, which seems to be improving except for the bilirubin.  Clinically, she has no right upper quadrant abdominal pain.  As she is improving, would hesitate rushing the patient off to the operating room for a cholecystectomy.  I do not know if her frozen meat had anything to do with this.  Discussed with Dr. Gwenlyn Perking.  At this time, will advance to a carb modified heart healthy diet.  Will follow her liver enzyme tests.  Franky Macho 04/25/2023, 10:33 AM

## 2023-04-25 NOTE — Progress Notes (Signed)
   04/25/23 0345  Provider Notification  Provider Name/Title Arville Care, MD  Date Provider Notified 04/25/23  Time Provider Notified [phone]  Method of Notification Page  Test performed and critical result Anaerobic bottle: gram positive rods  Date Critical Result Received 04/25/23  Provider response No new orders

## 2023-04-25 NOTE — Plan of Care (Signed)
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
## 2023-04-26 DIAGNOSIS — N1832 Chronic kidney disease, stage 3b: Secondary | ICD-10-CM | POA: Diagnosis not present

## 2023-04-26 DIAGNOSIS — A419 Sepsis, unspecified organism: Secondary | ICD-10-CM | POA: Diagnosis not present

## 2023-04-26 DIAGNOSIS — J101 Influenza due to other identified influenza virus with other respiratory manifestations: Secondary | ICD-10-CM | POA: Diagnosis not present

## 2023-04-26 DIAGNOSIS — R748 Abnormal levels of other serum enzymes: Secondary | ICD-10-CM | POA: Diagnosis not present

## 2023-04-26 DIAGNOSIS — I10 Essential (primary) hypertension: Secondary | ICD-10-CM | POA: Diagnosis not present

## 2023-04-26 LAB — COMPREHENSIVE METABOLIC PANEL WITH GFR
ALT: 164 U/L — ABNORMAL HIGH (ref 0–44)
AST: 111 U/L — ABNORMAL HIGH (ref 15–41)
Albumin: 2.8 g/dL — ABNORMAL LOW (ref 3.5–5.0)
Alkaline Phosphatase: 142 U/L — ABNORMAL HIGH (ref 38–126)
Anion gap: 10 (ref 5–15)
BUN: 10 mg/dL (ref 8–23)
CO2: 18 mmol/L — ABNORMAL LOW (ref 22–32)
Calcium: 9.1 mg/dL (ref 8.9–10.3)
Chloride: 111 mmol/L (ref 98–111)
Creatinine, Ser: 1.29 mg/dL — ABNORMAL HIGH (ref 0.44–1.00)
GFR, Estimated: 43 mL/min — ABNORMAL LOW (ref >60)
Glucose, Bld: 142 mg/dL — ABNORMAL HIGH (ref 70–99)
Potassium: 4 mmol/L (ref 3.5–5.1)
Sodium: 139 mmol/L (ref 135–145)
Total Bilirubin: 4.6 mg/dL — ABNORMAL HIGH (ref 0.0–1.2)
Total Protein: 6.1 g/dL — ABNORMAL LOW (ref 6.5–8.1)

## 2023-04-26 LAB — CBC
HCT: 38.8 % (ref 36.0–46.0)
Hemoglobin: 12.6 g/dL (ref 12.0–15.0)
MCH: 27.2 pg (ref 26.0–34.0)
MCHC: 32.5 g/dL (ref 30.0–36.0)
MCV: 83.6 fL (ref 80.0–100.0)
Platelets: 334 10*3/uL (ref 150–400)
RBC: 4.64 MIL/uL (ref 3.87–5.11)
RDW: 12.5 % (ref 11.5–15.5)
WBC: 9.1 10*3/uL (ref 4.0–10.5)
nRBC: 0 % (ref 0.0–0.2)

## 2023-04-26 LAB — HEMOGLOBIN A1C
Hgb A1c MFr Bld: 7.6 % — ABNORMAL HIGH (ref 4.8–5.6)
Mean Plasma Glucose: 171 mg/dL

## 2023-04-26 LAB — CULTURE, BLOOD (ROUTINE X 2)

## 2023-04-26 LAB — GLUCOSE, CAPILLARY
Glucose-Capillary: 144 mg/dL — ABNORMAL HIGH (ref 70–99)
Glucose-Capillary: 165 mg/dL — ABNORMAL HIGH (ref 70–99)
Glucose-Capillary: 179 mg/dL — ABNORMAL HIGH (ref 70–99)
Glucose-Capillary: 197 mg/dL — ABNORMAL HIGH (ref 70–99)

## 2023-04-26 MED ORDER — SULFAMETHOXAZOLE-TRIMETHOPRIM 800-160 MG PO TABS
1.0000 | ORAL_TABLET | Freq: Two times a day (BID) | ORAL | Status: DC
  Administered 2023-04-26 – 2023-04-27 (×2): 1 via ORAL
  Filled 2023-04-26 (×2): qty 1

## 2023-04-26 NOTE — Progress Notes (Signed)
 Subjective: No abdominal pain.  No nausea or vomiting with eating.  Objective: Vital signs in last 24 hours: Temp:  [97.8 F (36.6 C)-98.4 F (36.9 C)] 98.2 F (36.8 C) (03/31 0416) Pulse Rate:  [89-95] 90 (03/31 0416) BP: (156-171)/(71-90) 167/87 (03/31 0416) SpO2:  [97 %-100 %] 98 % (03/31 0416)    Intake/Output from previous day: 03/30 0701 - 03/31 0700 In: 243 [P.O.:240; I.V.:3] Out: -  Intake/Output this shift: No intake/output data recorded.  General appearance: alert, cooperative, and no distress GI: soft, non-tender; bowel sounds normal; no masses,  no organomegaly  Lab Results:  Recent Labs    04/25/23 0411 04/26/23 0420  WBC 12.6* 9.1  HGB 12.2 12.6  HCT 36.4 38.8  PLT 339 334   BMET Recent Labs    04/25/23 0411 04/26/23 0420  NA 143 139  K 4.1 4.0  CL 116* 111  CO2 18* 18*  GLUCOSE 138* 142*  BUN 11 10  CREATININE 1.37* 1.29*  CALCIUM 8.8* 9.1   PT/INR Recent Labs    04/23/23 2041  LABPROT 14.0  INR 1.1    Studies/Results: MR ABDOMEN MRCP W WO CONTAST Result Date: 04/24/2023 CLINICAL DATA:  Jaundice.  Nausea and vomiting with abdominal pain. EXAM: MRI ABDOMEN WITHOUT AND WITH CONTRAST (INCLUDING MRCP) TECHNIQUE: Multiplanar multisequence MR imaging of the abdomen was performed both before and after the administration of intravenous contrast. Heavily T2-weighted images of the biliary and pancreatic ducts were obtained, and three-dimensional MRCP images were rendered by post processing. CONTRAST:  7.76mL GADAVIST GADOBUTROL 1 MMOL/ML IV SOLN COMPARISON:  04/23/2023 FINDINGS: Lower chest: Small bilateral pleural effusions. Hepatobiliary: Mild hepatic steatosis. No enhancing liver abnormality. Sludge is seen layering within the dependent portions of the gallbladder. Mild gallbladder wall thickening is noted measuring 5 mm. No gallstones identified. No common bile duct or intrahepatic bile duct dilatation. Abrupt termination of the normal caliber  distal common bile duct is noted on the MRCP images. This is a nonspecific finding. No common bile duct stones noted proximal to this level. Pancreas: No mass, inflammatory changes, or other parenchymal abnormality identified. Spleen:  Within normal limits in size and appearance. Adrenals/Urinary Tract: Normal adrenal glands. No kidney mass. Multiple bilateral parapelvic cysts noted. Bilateral perinephric soft tissue stranding, nonspecific. Stomach/Bowel: Visualized portions within the abdomen are unremarkable. Vascular/Lymphatic: Normal caliber of the abdominal aorta. Patent abdominal vascularity. Other:  No significant free fluid.  No fluid collections. Musculoskeletal: No suspicious bone lesions identified. IMPRESSION: 1. Sludge is seen layering within the dependent portions of the gallbladder. Mild gallbladder wall thickening is noted measuring 5 mm. No gallstones identified. Findings are equivocal for acute cholecystitis. If there is clinical concern for acute cholecystitis consider further evaluation with HIDA scan. 2. No common bile duct or intrahepatic bile duct dilatation. Abrupt termination of the normal caliber distal common bile duct is noted on the MRCP images. This is a nonspecific finding. No common bile duct stones noted proximal to this level. Small stones within the distal common bile duct would be difficult to exclude with a high degree of certainty. 3. Mild hepatic steatosis. 4. Small bilateral pleural effusions. Electronically Signed   By: Signa Kell M.D.   On: 04/24/2023 13:47   MR 3D Recon At Scanner Result Date: 04/24/2023 CLINICAL DATA:  Jaundice.  Nausea and vomiting with abdominal pain. EXAM: MRI ABDOMEN WITHOUT AND WITH CONTRAST (INCLUDING MRCP) TECHNIQUE: Multiplanar multisequence MR imaging of the abdomen was performed both before and after the administration of intravenous  contrast. Heavily T2-weighted images of the biliary and pancreatic ducts were obtained, and  three-dimensional MRCP images were rendered by post processing. CONTRAST:  7.35mL GADAVIST GADOBUTROL 1 MMOL/ML IV SOLN COMPARISON:  04/23/2023 FINDINGS: Lower chest: Small bilateral pleural effusions. Hepatobiliary: Mild hepatic steatosis. No enhancing liver abnormality. Sludge is seen layering within the dependent portions of the gallbladder. Mild gallbladder wall thickening is noted measuring 5 mm. No gallstones identified. No common bile duct or intrahepatic bile duct dilatation. Abrupt termination of the normal caliber distal common bile duct is noted on the MRCP images. This is a nonspecific finding. No common bile duct stones noted proximal to this level. Pancreas: No mass, inflammatory changes, or other parenchymal abnormality identified. Spleen:  Within normal limits in size and appearance. Adrenals/Urinary Tract: Normal adrenal glands. No kidney mass. Multiple bilateral parapelvic cysts noted. Bilateral perinephric soft tissue stranding, nonspecific. Stomach/Bowel: Visualized portions within the abdomen are unremarkable. Vascular/Lymphatic: Normal caliber of the abdominal aorta. Patent abdominal vascularity. Other:  No significant free fluid.  No fluid collections. Musculoskeletal: No suspicious bone lesions identified. IMPRESSION: 1. Sludge is seen layering within the dependent portions of the gallbladder. Mild gallbladder wall thickening is noted measuring 5 mm. No gallstones identified. Findings are equivocal for acute cholecystitis. If there is clinical concern for acute cholecystitis consider further evaluation with HIDA scan. 2. No common bile duct or intrahepatic bile duct dilatation. Abrupt termination of the normal caliber distal common bile duct is noted on the MRCP images. This is a nonspecific finding. No common bile duct stones noted proximal to this level. Small stones within the distal common bile duct would be difficult to exclude with a high degree of certainty. 3. Mild hepatic steatosis.  4. Small bilateral pleural effusions. Electronically Signed   By: Signa Kell M.D.   On: 04/24/2023 13:47    Anti-infectives: Anti-infectives (From admission, onward)    Start     Dose/Rate Route Frequency Ordered Stop   04/24/23 1000  oseltamivir (TAMIFLU) capsule 30 mg        30 mg Oral 2 times daily 04/23/23 2337 04/28/23 2159   04/24/23 0000  piperacillin-tazobactam (ZOSYN) IVPB 3.375 g        3.375 g 12.5 mL/hr over 240 Minutes Intravenous Every 8 hours 04/23/23 2352     04/23/23 2345  oseltamivir (TAMIFLU) capsule 75 mg        75 mg Oral  Once 04/23/23 2337 04/24/23 0045   04/23/23 2030  cefTRIAXone (ROCEPHIN) 2 g in sodium chloride 0.9 % 100 mL IVPB        2 g 200 mL/hr over 30 Minutes Intravenous Once 04/23/23 2020 04/23/23 2112       Assessment/Plan: Impression: Transaminitis with hyperbilirubinemia slowly resolving.  Patient has no clinical symptoms of cholecystitis.  At this point, no need for urgent cholecystectomy.  Continue treatment for bacteremia.  Will continue to follow with you.  Agree with IV antibiotics for bacteremia.  LOS: 3 days    Martha Ray 04/26/2023

## 2023-04-26 NOTE — Plan of Care (Signed)
  Problem: Education: Goal: Knowledge of General Education information will improve Description: Including pain rating scale, medication(s)/side effects and non-pharmacologic comfort measures Outcome: Progressing   Problem: Health Behavior/Discharge Planning: Goal: Ability to manage health-related needs will improve Outcome: Progressing   Problem: Clinical Measurements: Goal: Ability to maintain clinical measurements within normal limits will improve Outcome: Progressing Goal: Will remain free from infection Outcome: Progressing Goal: Diagnostic test results will improve Outcome: Progressing   Problem: Activity: Goal: Risk for activity intolerance will decrease Outcome: Progressing   Problem: Pain Managment: Goal: General experience of comfort will improve and/or be controlled Outcome: Progressing   Problem: Safety: Goal: Ability to remain free from injury will improve Outcome: Progressing   Problem: Education: Goal: Ability to describe self-care measures that may prevent or decrease complications (Diabetes Survival Skills Education) will improve Outcome: Progressing   Problem: Coping: Goal: Ability to adjust to condition or change in health will improve Outcome: Progressing   Problem: Fluid Volume: Goal: Ability to maintain a balanced intake and output will improve Outcome: Progressing   Problem: Health Behavior/Discharge Planning: Goal: Ability to identify and utilize available resources and services will improve Outcome: Progressing Goal: Ability to manage health-related needs will improve Outcome: Progressing   Problem: Metabolic: Goal: Ability to maintain appropriate glucose levels will improve Outcome: Progressing   Problem: Nutritional: Goal: Maintenance of adequate nutrition will improve Outcome: Progressing Goal: Progress toward achieving an optimal weight will improve Outcome: Progressing   Problem: Skin Integrity: Goal: Risk for impaired skin  integrity will decrease Outcome: Progressing   Problem: Tissue Perfusion: Goal: Adequacy of tissue perfusion will improve Outcome: Progressing

## 2023-04-26 NOTE — Progress Notes (Signed)
 Mobility Specialist Progress Note:    04/26/23 1020  Mobility  Activity Ambulated with assistance to bathroom  Level of Assistance Modified independent, requires aide device or extra time  Assistive Device None  Distance Ambulated (ft) 30 ft  Range of Motion/Exercises Active;All extremities  Activity Response Tolerated well  Mobility Referral Yes  Mobility visit 1 Mobility  Mobility Specialist Start Time (ACUTE ONLY) 1000  Mobility Specialist Stop Time (ACUTE ONLY) 1020  Mobility Specialist Time Calculation (min) (ACUTE ONLY) 20 min   Pt received in bed requesting assistance to bathroom. ModI to stand and ambulate with no AD. Tolerated well, asx throughout. Left pt sitting EOB with NT, all needs met.  Lawerance Bach Mobility Specialist Please contact via Special educational needs teacher or  Rehab office at [phone]

## 2023-04-26 NOTE — Progress Notes (Signed)
 Mobility Specialist Progress Note:    04/26/23 1045  Mobility  Activity Ambulated with assistance in room;Stood at bedside  Level of Assistance Modified independent, requires aide device or extra time  Assistive Device None  Distance Ambulated (ft) 65 ft  Range of Motion/Exercises Active;All extremities  Activity Response Tolerated well  Mobility Referral Yes  Mobility visit 1 Mobility  Mobility Specialist Start Time (ACUTE ONLY) 1030  Mobility Specialist Stop Time (ACUTE ONLY) 1045  Mobility Specialist Time Calculation (min) (ACUTE ONLY) 15 min   Pt received requesting assistance to ambulate. ModI to stand and ambulate with no AD. Tolerated well, asx throughout. Left pt standing EOB, all needs met.  Lawerance Bach Mobility Specialist Please contact via Special educational needs teacher or  Rehab office at [phone]
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
## 2023-04-27 DIAGNOSIS — J101 Influenza due to other identified influenza virus with other respiratory manifestations: Secondary | ICD-10-CM | POA: Diagnosis not present

## 2023-04-27 DIAGNOSIS — Z8673 Personal history of transient ischemic attack (TIA), and cerebral infarction without residual deficits: Secondary | ICD-10-CM

## 2023-04-27 DIAGNOSIS — A419 Sepsis, unspecified organism: Secondary | ICD-10-CM | POA: Diagnosis not present

## 2023-04-27 DIAGNOSIS — R7989 Other specified abnormal findings of blood chemistry: Secondary | ICD-10-CM | POA: Diagnosis not present

## 2023-04-27 DIAGNOSIS — E1122 Type 2 diabetes mellitus with diabetic chronic kidney disease: Secondary | ICD-10-CM | POA: Diagnosis not present

## 2023-04-27 LAB — CULTURE, BLOOD (ROUTINE X 2)

## 2023-04-27 LAB — COMPREHENSIVE METABOLIC PANEL WITH GFR
ALT: 188 U/L — ABNORMAL HIGH (ref 0–44)
AST: 141 U/L — ABNORMAL HIGH (ref 15–41)
Albumin: 2.8 g/dL — ABNORMAL LOW (ref 3.5–5.0)
Alkaline Phosphatase: 188 U/L — ABNORMAL HIGH (ref 38–126)
Anion gap: 9 (ref 5–15)
BUN: 11 mg/dL (ref 8–23)
CO2: 20 mmol/L — ABNORMAL LOW (ref 22–32)
Calcium: 9 mg/dL (ref 8.9–10.3)
Chloride: 108 mmol/L (ref 98–111)
Creatinine, Ser: 1.18 mg/dL — ABNORMAL HIGH (ref 0.44–1.00)
GFR, Estimated: 48 mL/min — ABNORMAL LOW (ref >60)
Glucose, Bld: 174 mg/dL — ABNORMAL HIGH (ref 70–99)
Potassium: 3.9 mmol/L (ref 3.5–5.1)
Sodium: 137 mmol/L (ref 135–145)
Total Bilirubin: 2.5 mg/dL — ABNORMAL HIGH (ref 0.0–1.2)
Total Protein: 6.5 g/dL (ref 6.5–8.1)

## 2023-04-27 LAB — CBC WITH DIFFERENTIAL/PLATELET
Abs Immature Granulocytes: 0.12 10*3/uL — ABNORMAL HIGH (ref 0.00–0.07)
Basophils Absolute: 0.1 10*3/uL (ref 0.0–0.1)
Basophils Relative: 1 %
Eosinophils Absolute: 0.2 10*3/uL (ref 0.0–0.5)
Eosinophils Relative: 3 %
HCT: 39.5 % (ref 36.0–46.0)
Hemoglobin: 13.2 g/dL (ref 12.0–15.0)
Immature Granulocytes: 2 %
Lymphocytes Relative: 21 %
Lymphs Abs: 1.5 10*3/uL (ref 0.7–4.0)
MCH: 27.6 pg (ref 26.0–34.0)
MCHC: 33.4 g/dL (ref 30.0–36.0)
MCV: 82.5 fL (ref 80.0–100.0)
Monocytes Absolute: 0.8 10*3/uL (ref 0.1–1.0)
Monocytes Relative: 10 %
Neutro Abs: 4.6 10*3/uL (ref 1.7–7.7)
Neutrophils Relative %: 63 %
Platelets: 372 10*3/uL (ref 150–400)
RBC: 4.79 MIL/uL (ref 3.87–5.11)
RDW: 12.3 % (ref 11.5–15.5)
WBC: 7.3 10*3/uL (ref 4.0–10.5)
nRBC: 0 % (ref 0.0–0.2)

## 2023-04-27 LAB — GLUCOSE, CAPILLARY
Glucose-Capillary: 164 mg/dL — ABNORMAL HIGH (ref 70–99)
Glucose-Capillary: 187 mg/dL — ABNORMAL HIGH (ref 70–99)

## 2023-04-27 MED ORDER — SULFAMETHOXAZOLE-TRIMETHOPRIM 800-160 MG PO TABS: 1.0000 | ORAL_TABLET | Freq: Two times a day (BID) | ORAL | 0 refills | Status: AC

## 2023-04-27 MED ORDER — HYDRALAZINE HCL 20 MG/ML IJ SOLN
20.0000 mg | Freq: Once | INTRAMUSCULAR | Status: AC
Start: 2023-04-27 — End: 2023-04-27
  Administered 2023-04-27: 20 mg via INTRAVENOUS
  Filled 2023-04-27: qty 1

## 2023-04-27 NOTE — Progress Notes (Signed)
  Subjective: No abdominal complaints.  Tolerating diet well.  Objective: Vital signs in last 24 hours: Temp:  [98.3 F (36.8 C)-99 F (37.2 C)] 98.3 F (36.8 C) (04/01 0825) Pulse Rate:  [86-97] 97 (04/01 0825) Resp:  [18-20] 19 (04/01 0825) BP: (166-178)/(83-98) 178/98 (04/01 0825) SpO2:  [97 %-99 %] 97 % (04/01 0825)    Intake/Output from previous day: 03/31 0701 - 04/01 0700 In: 243 [P.O.:240; I.V.:3] Out: -  Intake/Output this shift: No intake/output data recorded.  General appearance: alert, cooperative, and no distress GI: soft, non-tender; bowel sounds normal; no masses,  no organomegaly  Lab Results:  Recent Labs    04/26/23 0420 04/27/23 0740  WBC 9.1 7.3  HGB 12.6 13.2  HCT 38.8 39.5  PLT 334 372   BMET Recent Labs    04/25/23 0411 04/26/23 0420  NA 143 139  K 4.1 4.0  CL 116* 111  CO2 18* 18*  GLUCOSE 138* 142*  BUN 11 10  CREATININE 1.37* 1.29*  CALCIUM 8.8* 9.1   PT/INR No results for input(s): "LABPROT", "INR" in the last 72 hours.  Studies/Results: No results found.  Anti-infectives: Anti-infectives (From admission, onward)    Start     Dose/Rate Route Frequency Ordered Stop   04/26/23 2200  sulfamethoxazole-trimethoprim (BACTRIM DS) 800-160 MG per tablet 1 tablet        1 tablet Oral Every 12 hours 04/26/23 1841     04/24/23 1000  oseltamivir (TAMIFLU) capsule 30 mg        30 mg Oral 2 times daily 04/23/23 2337 04/28/23 2159   04/24/23 0000  piperacillin-tazobactam (ZOSYN) IVPB 3.375 g  Status:  Discontinued        3.375 g 12.5 mL/hr over 240 Minutes Intravenous Every 8 hours 04/23/23 2352 04/26/23 1841   04/23/23 2345  oseltamivir (TAMIFLU) capsule 75 mg        75 mg Oral  Once 04/23/23 2337 04/24/23 0045   04/23/23 2030  cefTRIAXone (ROCEPHIN) 2 g in sodium chloride 0.9 % 100 mL IVPB        2 g 200 mL/hr over 30 Minutes Intravenous Once 04/23/23 2020 04/23/23 2112       Assessment/Plan: Impression: Abdominal pain of  unknown etiology, resolved.  Labs are pending concerning elevated liver enzyme test.  No need for acute surgical invention at the present time. Plan: Will check labs.  Should patient be discharged, she will follow-up in my office in 1 month.  LOS: 4 days    Franky Macho 04/27/2023

## 2023-04-27 NOTE — Plan of Care (Signed)
  Problem: Education: Goal: Knowledge of General Education information will improve Description: Including pain rating scale, medication(s)/side effects and non-pharmacologic comfort measures Outcome: Progressing   Problem: Health Behavior/Discharge Planning: Goal: Ability to manage health-related needs will improve Outcome: Progressing   Problem: Clinical Measurements: Goal: Ability to maintain clinical measurements within normal limits will improve Outcome: Progressing Goal: Will remain free from infection Outcome: Progressing Goal: Diagnostic test results will improve Outcome: Progressing   Problem: Activity: Goal: Risk for activity intolerance will decrease Outcome: Progressing   Problem: Safety: Goal: Ability to remain free from injury will improve Outcome: Progressing   Problem: Education: Goal: Ability to describe self-care measures that may prevent or decrease complications (Diabetes Survival Skills Education) will improve Outcome: Progressing   Problem: Coping: Goal: Ability to adjust to condition or change in health will improve Outcome: Progressing   Problem: Fluid Volume: Goal: Ability to maintain a balanced intake and output will improve Outcome: Progressing   Problem: Metabolic: Goal: Ability to maintain appropriate glucose levels will improve Outcome: Progressing   Problem: Nutritional: Goal: Maintenance of adequate nutrition will improve Outcome: Progressing   Problem: Skin Integrity: Goal: Risk for impaired skin integrity will decrease Outcome: Progressing   Problem: Tissue Perfusion: Goal: Adequacy of tissue perfusion will improve Outcome: Progressing

## 2023-04-27 NOTE — Progress Notes (Signed)
 Patient discharged home with instructions given on medications and follow up visits,verbalized understanding.Prescriptions sent to Pharmacy of choice documented on AVS. IV discontinued,catheter intact. Staff to accompany patient to awaiting vehicle.
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
## 2023-04-27 NOTE — Progress Notes (Signed)
 Nurse at bedside this am,patient is alert and oriented times four.No c/o pain or discomfort noted this am.Blood pressure 178/98,Dr Madera notified. See new orders.Plan of care on going.

## 2023-05-25 ENCOUNTER — Ambulatory Visit: Admitting: General Surgery

## 2023-06-10 ENCOUNTER — Ambulatory Visit: Admitting: General Surgery
